# Patient Record
Sex: Female | Born: 1970 | Hispanic: No | Smoking: Never smoker
Health system: Southern US, Community
[De-identification: ages and names within clinical notes are randomized; demographics above are authoritative.]

## PROBLEM LIST (undated history)

## (undated) DIAGNOSIS — IMO0001 Reserved for inherently not codable concepts without codable children: Secondary | ICD-10-CM

## (undated) DIAGNOSIS — Z973 Presence of spectacles and contact lenses: Secondary | ICD-10-CM

## (undated) DIAGNOSIS — Z923 Personal history of irradiation: Secondary | ICD-10-CM

## (undated) DIAGNOSIS — D649 Anemia, unspecified: Secondary | ICD-10-CM

## (undated) DIAGNOSIS — C539 Malignant neoplasm of cervix uteri, unspecified: Secondary | ICD-10-CM

## (undated) HISTORY — PX: WISDOM TOOTH EXTRACTION: SHX21

## (undated) HISTORY — DX: Personal history of irradiation: Z92.3

---

## 2009-12-27 ENCOUNTER — Ambulatory Visit (HOSPITAL_COMMUNITY): Admission: RE | Admit: 2009-12-27 | Discharge: 2009-12-27 | Payer: Self-pay | Admitting: Obstetrics

## 2010-04-22 DIAGNOSIS — IMO0001 Reserved for inherently not codable concepts without codable children: Secondary | ICD-10-CM

## 2010-04-22 HISTORY — DX: Reserved for inherently not codable concepts without codable children: IMO0001

## 2011-10-14 ENCOUNTER — Inpatient Hospital Stay (HOSPITAL_COMMUNITY): Payer: Self-pay

## 2011-10-14 ENCOUNTER — Encounter (HOSPITAL_COMMUNITY): Payer: Self-pay | Admitting: Obstetrics and Gynecology

## 2011-10-14 ENCOUNTER — Inpatient Hospital Stay (HOSPITAL_COMMUNITY)
Admission: AD | Admit: 2011-10-14 | Discharge: 2011-10-14 | Disposition: A | Discharge status: Home / Self Care | Payer: Self-pay | Source: Ambulatory Visit | Attending: Obstetrics and Gynecology | Admitting: Obstetrics and Gynecology

## 2011-10-14 DIAGNOSIS — N921 Excessive and frequent menstruation with irregular cycle: Secondary | ICD-10-CM | POA: Diagnosis present

## 2011-10-14 DIAGNOSIS — D259 Leiomyoma of uterus, unspecified: Secondary | ICD-10-CM | POA: Diagnosis present

## 2011-10-14 DIAGNOSIS — D649 Anemia, unspecified: Secondary | ICD-10-CM | POA: Diagnosis present

## 2011-10-14 DIAGNOSIS — N946 Dysmenorrhea, unspecified: Secondary | ICD-10-CM | POA: Insufficient documentation

## 2011-10-14 DIAGNOSIS — N920 Excessive and frequent menstruation with regular cycle: Secondary | ICD-10-CM | POA: Insufficient documentation

## 2011-10-14 HISTORY — DX: Reserved for inherently not codable concepts without codable children: IMO0001

## 2011-10-14 LAB — CBC
MCH: 23.1 pg — ABNORMAL LOW (ref 26.0–34.0)
MCHC: 28.1 g/dL — ABNORMAL LOW (ref 30.0–36.0)
MCV: 82.1 fL (ref 78.0–100.0)
Platelets: 237 10*3/uL (ref 150–400)

## 2011-10-14 MED ORDER — MEDROXYPROGESTERONE ACETATE 150 MG/ML IM SUSP
150.0000 mg | INTRAMUSCULAR | Status: AC
  Administered 2011-10-14: 150 mg via INTRAMUSCULAR
  Filled 2011-10-14: qty 1

## 2011-10-14 MED ORDER — INTEGRA 62.5-62.5-40-3 MG PO CAPS: 1.0000 | ORAL_CAPSULE | Freq: Two times a day (BID) | ORAL | Status: DC

## 2011-10-14 MED ORDER — FERROUS SULFATE 325 (65 FE) MG PO TABS: 325.0000 mg | ORAL_TABLET | Freq: Three times a day (TID) | ORAL | Status: DC

## 2011-10-14 NOTE — Discharge Instructions (Signed)
Menorrhagia  Dysfunctional uterine bleeding is different from a normal menstrual period. When periods are heavy or there is more bleeding than is usual for you, it is called menorrhagia. It may be caused by hormonal imbalance, or physical, metabolic, or other problems. Examination is necessary in order that your caregiver may treat treatable causes. If this is a continuing problem, a D&C may be needed. That means that the cervix (the opening of the uterus or womb) is dilated (stretched larger) and the lining of the uterus is scraped out. The tissue scraped out is then examined under a microscope by a specialist (pathologist) to make sure there is nothing of concern that needs further or more extensive treatment.  HOME CARE INSTRUCTIONS    If medications were prescribed, take exactly as directed. Do not change or switch medications without consulting your caregiver.   Long term heavy bleeding may result in iron deficiency. Your caregiver may have prescribed iron pills. They help replace the iron your body lost from heavy bleeding. Take exactly as directed. Iron may cause constipation. If this becomes a problem, increase the bran, fruits, and roughage in your diet.   Do not take aspirin or medicines that contain aspirin one week before or during your menstrual period. Aspirin may make the bleeding worse.   If you need to change your sanitary pad or tampon more than once every 2 hours, stay in bed and rest as much as possible until the bleeding stops.   Eat well-balanced meals. Eat foods high in iron. Examples are leafy green vegetables, meat, liver, eggs, and whole grain breads and cereals. Do not try to lose weight until the abnormal bleeding has stopped and your blood iron level is back to normal.  SEEK MEDICAL CARE IF:    You need to change your sanitary pad or tampon more than once an hour.   You develop nausea (feeling sick to your stomach) and vomiting, dizziness, or diarrhea while you are taking your  medicine.   You have any problems that may be related to the medicine you are taking.  SEEK IMMEDIATE MEDICAL CARE IF:    You have a fever.   You develop chills.   You develop severe bleeding or start to pass blood clots.   You feel dizzy or faint.  MAKE SURE YOU:    Understand these instructions.   Will watch your condition.   Will get help right away if you are not doing well or get worse.  Document Released: 04/08/2005 Document Revised: 03/28/2011 Document Reviewed: 11/27/2007  ExitCare Patient Information 2012 [employer].  Iron Deficiency Anemia  There are many types of anemia. Iron deficiency anemia is the most common. Iron deficiency anemia is a decrease in the number of red blood cells caused by too little iron. Without enough iron, your body does not produce enough hemoglobin. Hemoglobin is a substance in red blood cells that carries oxygen to the body's tissues. Iron deficiency anemia may leave you tired and short of breath.  CAUSES    Lack of iron in the diet.   This may be seen in infants and children, because there is little iron in milk.   This may be seen in adults who do not eat enough iron-rich foods.   This may be seen in pregnant or breastfeeding women who do not take iron supplements. There is a much higher need for iron intake at these times.   Poor absorption of iron, as seen with intestinal disorders.     Intestinal bleeding.   Heavy periods.  SYMPTOMS   Mild anemia may not be noticeable. Symptoms may include:   Fatigue.   Headache.   Pale skin.   Weakness.   Shortness of breath.   Dizziness.   Cold hands and feet.   Fast or irregular heartbeat.  DIAGNOSIS   Diagnosis requires a thorough evaluation and physical exam by your caregiver.   Blood tests are generally used to confirm iron deficiency anemia.   Additional tests may be done to find the underlying cause of your anemia. These may include:   Testing for blood in the stool (fecal occult blood test).   A  procedure to see inside the colon and rectum (colonoscopy).   A procedure to see inside the esophagus and stomach (endoscopy).  TREATMENT    Correcting the cause of the iron deficiency is the first step.   Medicines, such as oral contraceptives, can make heavy menstrual flows lighter.   Antibiotics and other medicines can be used to treat peptic ulcers.   Surgery may be needed to remove a bleeding polyp, tumor, or fibroid.   Often, iron supplements (ferrous sulfate) are taken.   For the best iron absorption, take these supplements with an empty stomach.   You may need to take the supplements with food if you cannot tolerate them on an empty stomach. Vitamin C improves the absorption of iron. Your caregiver may recommend taking your iron tablets with a glass of orange juice or vitamin C supplement.   Milk and antacids should not be taken at the same time as iron supplements. They may interfere with the absorption of iron.   Iron supplements can cause constipation. A stool softener is often recommended.   Pregnant and breastfeeding women will need to take extra iron, because their normal diet usually will not provide the required amount.   Patients who cannot tolerate iron by mouth can take it through a vein (intravenously) or by an injection into the muscle.  HOME CARE INSTRUCTIONS    Ask your dietitian for help with diet questions.   Take iron and vitamins as directed by your caregiver.   Eat a diet rich in iron. Eat liver, lean beef, whole-grain bread, eggs, dried fruit, and dark green leafy vegetables.  SEEK IMMEDIATE MEDICAL CARE IF:    You have a fainting episode. Do not drive yourself. Call your local emergency services (911 in [place] if no other help is available.   You have chest pain, nausea, or vomiting.   You develop severe or increased shortness of breath with activities.   You develop weakness or increased thirst.   You have a rapid heartbeat.   You develop unexplained sweating or  become lightheaded when getting up from a chair or bed.  MAKE SURE YOU:    Understand these instructions.   Will watch your condition.   Will get help right away if you are not doing well or get worse.  Document Released: 04/05/2000 Document Revised: 03/28/2011 Document Reviewed: 08/15/2009  ExitCare Patient Information 2012 [employer].

## 2011-10-14 NOTE — MAU Note (Signed)
"  I've been bleeding since March.  I am having some cramping.  It is cramping in the whole abd.  I was seen in 2012 at the [facility] clinic and they gave me iron for 1 month."

## 2011-10-14 NOTE — MAU Provider Note (Signed)
History     CSN: [id number]  Arrival date and time: 10/14/11 1149   First Provider Initiated Contact with Patient 10/14/11 1352      Chief Complaint  Patient presents with  . Dysmenorrhea   HPI [id number] presents to MAU with report of menses x3 months with continued daily vaginal bleeding every day during that time.  She was evaluated for heavy menses in 2011 or 2012 and diagnosed with uterine fibroids at that time.  She refuses hospital admission for treatment and desires medication to reduce bleeding and iron for her anemia.  She is not sexually active and denies vaginal itching/burning, urinary symptoms, h/a, dizziness, n/v, or fever/chills.  She does report increasing weakness as her bleeding continues.  Language line for [employer] used for all communication.   Past Medical History  Diagnosis Date  . Fibroid (bleeding) (uterine) 2012    Past Surgical History  Procedure Date  . No past surgeries     History reviewed. No pertinent family history.  History  Substance Use Topics  . Smoking status: Never Smoker   . Smokeless tobacco: Not on file  . Alcohol Use: No    Allergies: No Known Allergies  Prescriptions prior to admission  Medication Sig Dispense Refill  . ferrous sulfate 325 (65 FE) MG tablet Take 325 mg by mouth daily with breakfast.        Review of Systems  Constitutional: Positive for malaise/fatigue. Negative for fever and chills.  Eyes: Negative for blurred vision.  Respiratory: Negative for cough and shortness of breath.   Cardiovascular: Negative for chest pain.  Gastrointestinal: Negative for heartburn, nausea, vomiting and abdominal pain.  Genitourinary: Negative for dysuria, urgency and frequency.  Musculoskeletal: Negative.   Neurological: Positive for weakness. Negative for dizziness and headaches.  Psychiatric/Behavioral: Negative for depression.   Physical Exam   Blood pressure 130/64, pulse 77, temperature 98.2 F (36.8 C),  temperature source Oral, resp. rate 18, height 5\' 3"  (1.6 m), weight 82.668 kg (182 lb 4 oz), last menstrual period 07/05/2011, SpO2 100.00%. Orthostatic VS WNL  Physical Exam  Nursing note and vitals reviewed. Constitutional: She is oriented to person, place, and time. She appears well-developed and well-nourished.  Neck: Normal range of motion.  Cardiovascular: Normal rate, regular rhythm and normal heart sounds.   Respiratory: Effort normal and breath sounds normal.  GI: Soft. Bowel sounds are normal.  Genitourinary:       Pelvic exam: Cervix pink, visually closed, without lesion, moderate amount dark red blood pooling in vagina, no clots noted, vaginal walls and external genitalia normal Bimanual exam: Cervix 0/long/high, firm, posterior, neg CMT, uterus nontender, slightly enlarged (~7week size) and irregular in shape, adnexa without tenderness, enlargement, or mass  Musculoskeletal: Normal range of motion.  Neurological: She is alert and oriented to person, place, and time.  Skin: Skin is warm and dry.  Psychiatric: She has a normal mood and affect. Her behavior is normal. Judgment and thought content normal.    MAU Course  Procedures CBC, pelvic/bimanual exam, pelvic u/s  Results for orders placed during the hospital encounter of 10/14/11 (from the past 24 hour(s))  CBC     Status: Abnormal   Collection Time   10/14/11 12:23 PM      Component Value Range   WBC 5.5  4.0 - 10.5 K/uL   RBC 2.34 (*) 3.87 - 5.11 MIL/uL   Hemoglobin 5.4 (*) 12.0 - 15.0 g/dL   HCT 47.8 (*) 29.5 - 62.1 %  MCV 82.1  78.0 - 100.0 fL   MCH 23.1 (*) 26.0 - 34.0 pg   MCHC 28.1 (*) 30.0 - 36.0 g/dL   RDW 13.0 (*) 86.5 - 78.4 %   Platelets 237  150 - 400 K/uL   US Transvaginal Non-ob  10/14/2011  *RADIOLOGY REPORT*  Clinical Data: Abnormal uterine bleeding.  Fibroids.  LMP 07/05/2011  TRANSABDOMINAL AND TRANSVAGINAL ULTRASOUND OF PELVIS  Technique:  Both transabdominal and transvaginal ultrasound  examinations of the pelvis were performed.  Transabdominal technique was performed for global imaging of the pelvis including uterus, ovaries, adnexal regions, and pelvic cul-de-sac.  It was necessary to proceed with endovaginal exam following the transabdominal exam to visualize the endometrium and ovaries.  Comparison:  12/27/2009  Findings: Uterus:  11.7 x 7.4 x 6.8 cm. An intramural fibroidis again seen in the posterior corpus which measures 3.5 cm in maximum diameter.  A second fibroid is seen in the anterior fundal region which measures approximately 3.6 cm, and has a partial submucosal component indenting the endometrial cavity.  Endometrium: Double layer endometrial thickness measures 12 mm.  Right ovary: 6.0 x 3.5 x 4.1 cm.  Several follicles, with dominant simple cyst measuring 3.8 cm in maximum diameter with benign features.  Left ovary: 4.4 x 2.0 x 2.7 cm.  Normal appearance.  2.2 cm simple cyst noted with benign features.  Other Findings:  No free fluid  IMPRESSION:  1.  No significant change in uterine fibroids, one of which in the fundus measuring 3.6 cm has a partial submucosal component involving the endometrial cavity.  Pelvic MRI with contrast may be helpful if surgical resection as planned. 2. 3.8 cm simple right ovarian cyst, with benign characteristics.  Original Report Authenticated By: Danae Orleans, M.D.   US Pelvis Complete  10/14/2011  *RADIOLOGY REPORT*  Clinical Data: Abnormal uterine bleeding.  Fibroids.  LMP 07/05/2011  TRANSABDOMINAL AND TRANSVAGINAL ULTRASOUND OF PELVIS  Technique:  Both transabdominal and transvaginal ultrasound examinations of the pelvis were performed.  Transabdominal technique was performed for global imaging of the pelvis including uterus, ovaries, adnexal regions, and pelvic cul-de-sac.  It was necessary to proceed with endovaginal exam following the transabdominal exam to visualize the endometrium and ovaries.  Comparison:  12/27/2009  Findings: Uterus:   11.7 x 7.4 x 6.8 cm. An intramural fibroidis again seen in the posterior corpus which measures 3.5 cm in maximum diameter.  A second fibroid is seen in the anterior fundal region which measures approximately 3.6 cm, and has a partial submucosal component indenting the endometrial cavity.  Endometrium: Double layer endometrial thickness measures 12 mm.  Right ovary: 6.0 x 3.5 x 4.1 cm.  Several follicles, with dominant simple cyst measuring 3.8 cm in maximum diameter with benign features.  Left ovary: 4.4 x 2.0 x 2.7 cm.  Normal appearance.  2.2 cm simple cyst noted with benign features.  Other Findings:  No free fluid  IMPRESSION:  1.  No significant change in uterine fibroids, one of which in the fundus measuring 3.6 cm has a partial submucosal component involving the endometrial cavity.  Pelvic MRI with contrast may be helpful if surgical resection as planned. 2. 3.8 cm simple right ovarian cyst, with benign characteristics.  Original Report Authenticated By: Danae Orleans, M.D.   Assessment and Plan  A: Menometrorrhagia Fibroid uterus Anemia, Hgb 5.4, pt stable and asymptomatic except mild weakness and fatigue   P: Pt refuses hospital admission and does not want blood products to treat  anemia Depo Provera dose x1 in MAU D/C home with bleeding precautions Discussed risks of severe anemia with pt and reasons to return to hospital Integra BID, discussed costs with pt without insurance and pt prefers r/t GI difficulties with PO iron Appointment for pt this week at Gyn clinic to f/u Return to MAU as needed  LEFTWICH-KIRBY, Ignazio Kincaid 10/14/2011, 3:00 PM

## 2011-10-14 NOTE — Progress Notes (Signed)
Bonna Gains, CNM recommending to pt the plan of wanting to admit pt for blood transfusion.  Pt states, "I cannot be admitted for blood.  I cannot afford to be admitted here.  I am not working and my husband just started working part-time.  I can treat myself at home with iron and I will follow-up with the clinic downstairs."

## 2011-10-14 NOTE — MAU Note (Signed)
Pt/spouse state pt has had menstrual cycle since March. May change pad q30 minutes at times. Feeling weak x53month.

## 2011-10-14 NOTE — MAU Provider Note (Signed)
Agree with above note.  Destynee Stringfellow 10/14/2011 5:16 PM

## 2011-10-18 ENCOUNTER — Ambulatory Visit: Payer: Self-pay | Admitting: Obstetrics & Gynecology

## 2011-11-26 ENCOUNTER — Telehealth: Payer: Self-pay | Admitting: Medical

## 2011-11-26 NOTE — Telephone Encounter (Signed)
Caitlin Flowers called on behalf of the pt who was seen in MAU for evaluation of heavy bleeding. She had an appt for f/u in the clinic which she was not aware of the subsequently no showed. [name] would like to discuss the "injection" that the patient states was given in MAU as well as the appropriate next steps for follow-up with our clinic. After review of the MAU note the patient saw Dr. Cherrie Gauze and was given Depo Provera 150 mg at the time of the MAU visit. LM for [name] to return call to clinic for more information regarding follow-up and the injection for this patient.

## 2011-11-27 ENCOUNTER — Ambulatory Visit (INDEPENDENT_AMBULATORY_CARE_PROVIDER_SITE_OTHER): Payer: Self-pay | Admitting: Obstetrics & Gynecology

## 2011-11-27 ENCOUNTER — Encounter: Payer: Self-pay | Admitting: Obstetrics & Gynecology

## 2011-11-27 VITALS — BP 124/69 | HR 80 | Temp 98.7°F | Ht 65.0 in | Wt 175.8 lb

## 2011-11-27 DIAGNOSIS — D649 Anemia, unspecified: Secondary | ICD-10-CM

## 2011-11-27 LAB — CBC
MCHC: 28.2 g/dL — ABNORMAL LOW (ref 30.0–36.0)
MCV: 72 fL — ABNORMAL LOW (ref 78.0–100.0)
Platelets: 308 10*3/uL (ref 150–400)
RDW: 21.3 % — ABNORMAL HIGH (ref 11.5–15.5)
WBC: 6.3 10*3/uL (ref 4.0–10.5)

## 2011-11-27 MED ORDER — MEDROXYPROGESTERONE ACETATE 5 MG PO TABS: 5.0000 mg | ORAL_TABLET | Freq: Every day | ORAL | Status: DC

## 2011-11-27 NOTE — Progress Notes (Signed)
Patient ID: Caitlin Flowers, female   DOB: 1970-05-02, 41 y.o.   MRN: [id number]  Chief Complaint  Patient presents with  . Fibroids    causing heavy bleeding, currently not bleeding spotted earlier today.  Pt has decided not to use Depo Provera.     HPI Caitlin Flowers is a 41 y.o. female.  Patient is a refugee from [place]. She has been in this country for since 2011. She has been diagnosed with fibroid uterus and menorrhagia and anemia and an MAU visit on June 24 showed severe anemia and ultrasound confirmed presence of fibroids. One fibroid had a submucosal component. She had previously been seen by another gynecologist in [place]. HPI  Past Medical History  Diagnosis Date  . Fibroid (bleeding) (uterine) 2012    Past Surgical History  Procedure Date  . No past surgeries     No family history on file.  Social History History  Substance Use Topics  . Smoking status: Never Smoker   . Smokeless tobacco: Never Used  . Alcohol Use: No    No Known Allergies  Current Outpatient Prescriptions  Medication Sig Dispense Refill  . medroxyPROGESTERone (DEPO-PROVERA) 150 MG/ML injection Inject 150 mg into the muscle every 3 (three) months.      . Fe Fum-FePoly-Vit C-Vit B3 (INTEGRA) 62.5-62.5-40-3 MG CAPS Take 1 tablet by mouth 2 (two) times daily.  30 capsule  2  . medroxyPROGESTERone (PROVERA) 5 MG tablet Take 1 tablet (5 mg total) by mouth daily.  30 tablet  3    Review of Systems Review of Systems  Constitutional: Positive for fatigue. Negative for fever.  Respiratory: Negative for chest tightness and shortness of breath.   Genitourinary: Positive for vaginal bleeding and menstrual problem.  Neurological: Positive for dizziness, weakness, light-headedness and headaches.    Blood pressure 124/69, pulse 80, temperature 98.7 F (37.1 C), temperature source Oral, height 5\' 5"  (1.651 m), weight 175 lb 12.8 oz (79.742 kg), last menstrual period 10/26/2011.  Physical Exam Physical  Exam  Constitutional: She appears well-developed and well-nourished.       Mildly pale  Pulmonary/Chest: Effort normal.  Abdominal: Soft. She exhibits no mass.  Genitourinary: Vagina normal. No vaginal discharge found.       Minimal blood. Cervix is normal. Uterus about 6-8 weeks size.  Neurological: She is alert.  Skin: Skin is warm and dry. There is pallor.  Psychiatric: She has a normal mood and affect. Her behavior is normal.    Data Reviewed *RADIOLOGY REPORT*  Clinical Data: Abnormal uterine bleeding. Fibroids. LMP  07/05/2011  TRANSABDOMINAL AND TRANSVAGINAL ULTRASOUND OF PELVIS  Technique: Both transabdominal and transvaginal ultrasound  examinations of the pelvis were performed. Transabdominal  technique was performed for global imaging of the pelvis including  uterus, ovaries, adnexal regions, and pelvic cul-de-sac.  It was necessary to proceed with endovaginal exam following the  transabdominal exam to visualize the endometrium and ovaries.  Comparison: 12/27/2009  Findings:  Uterus: 11.7 x 7.4 x 6.8 cm. An intramural fibroidis again seen in  the posterior corpus which measures 3.5 cm in maximum diameter. A  second fibroid is seen in the anterior fundal region which measures  approximately 3.6 cm, and has a partial submucosal component  indenting the endometrial cavity.  Endometrium: Double layer endometrial thickness measures 12 mm.  Right ovary: 6.0 x 3.5 x 4.1 cm. Several follicles, with dominant  simple cyst measuring 3.8 cm in maximum diameter with benign  features.  Left ovary: 4.4 x 2.0  x 2.7 cm. Normal appearance. 2.2 cm simple  cyst noted with benign features.  Other Findings: No free fluid  IMPRESSION:  1. No significant change in uterine fibroids, one of which in the  fundus measuring 3.6 cm has a partial submucosal component  involving the endometrial cavity. Pelvic MRI with contrast may be  helpful if surgical resection as planned.  2. 3.8 cm simple  right ovarian cyst, with benign characteristics.  Original Report Authenticated By: Danae Orleans, M.D.      CBC    Component Value Date/Time   WBC 5.5 10/14/2011 1223   RBC 2.34* 10/14/2011 1223   HGB 5.4* 10/14/2011 1223   HCT 19.2* 10/14/2011 1223   PLT 237 10/14/2011 1223   MCV 82.1 10/14/2011 1223   MCH 23.1* 10/14/2011 1223   MCHC 28.1* 10/14/2011 1223   RDW 20.5* 10/14/2011 1223     Assessment    Severe anemia and dysfunctional uterine bleeding with uterine fibroids, submucosal component.    Plan    Patient received Depo-Provera on 10/14/2011. She has had some breakthrough bleeding. I will add Provera 5 mg tablets by mouth daily. CBC is ordered today. She is a candidate for hysteroscopy and possible myomectomy. She may need more time with hormone therapy and iron to recover from her severe anemia prior to scheduling surgery.   counseled with help of [facility]  Interpreter [phone]    ARNOLD,JAMES 11/27/2011, 5:07 PM

## 2011-11-27 NOTE — Patient Instructions (Signed)
Fibroids You have been diagnosed as having a fibroid. Fibroids are smooth muscle lumps (tumors) which can occur any place in a woman's body. They are usually in the womb (uterus). The most common problem (symptom) of fibroids is bleeding. Over time this may cause low red blood cells (anemia). Other symptoms include feelings of pressure and pain in the pelvis. The diagnosis (learning what is wrong) of fibroids is made by physical exam. Sometimes tests such as an ultrasound are used. This is helpful when fibroids are felt around the ovaries and to look for tumors. TREATMENT   Most fibroids do not need surgical or medical treatment. Sometimes a tissue sample (biopsy) of the lining of the uterus is done to rule out cancer. If there is no cancer and only a small amount of bleeding, the problem can be watched.   Hormonal treatment can improve the problem.   When surgery is needed, it can consist of removing the fibroid. Vaginal birth may not be possible after the removal of fibroids. This depends on where they are and the extent of surgery. When pregnancy occurs with fibroids it is usually normal.   Your caregiver can help decide which treatments are best for you.  HOME CARE INSTRUCTIONS   Do not use aspirin as this may increase bleeding problems.   If your periods (menses) are heavy, record the number of pads or tampons used per month. Bring this information to your caregiver. This can help them determine the best treatment for you.  SEEK IMMEDIATE MEDICAL CARE IF:  You have pelvic pain or cramps not controlled with medications, or experience a sudden increase in pain.   You have an increase of pelvic bleeding between and during menses.   You feel lightheaded or have fainting spells.   You develop worsening belly (abdominal) pain.  Document Released: 04/05/2000 Document Revised: 03/28/2011 Document Reviewed: 11/26/2007 ExitCare Patient Information 2012 [employer].    

## 2011-11-27 NOTE — Progress Notes (Signed)
[facility] Interpreters # [phone]

## 2011-11-28 ENCOUNTER — Telehealth: Payer: Self-pay | Admitting: *Deleted

## 2011-11-28 ENCOUNTER — Telehealth: Payer: Self-pay | Admitting: General Practice

## 2011-11-28 NOTE — Telephone Encounter (Signed)
Message left on nurse voicemail Wednesday 8/7 @ 2132 from [facility] regarding critical results. Spoke with Dr Debroah Loop and notified him of patient's lab result and he stated he would evaluate the results himself.

## 2011-11-28 NOTE — Telephone Encounter (Signed)
Message copied by Mannie Stabile on Thu Nov 28, 2011  2:12 PM ------      Message from: Adam Phenix      Created: Thu Nov 28, 2011 10:01 AM       Continue with provera and iron and return as scheduled. Notify patient that iron is still low            ARNOLD,JAMES

## 2011-11-28 NOTE — Telephone Encounter (Signed)
Called patient using [employer] Amharic interpreter [phone] got message that phone not accepting calls at this time. Will retry patient again later.

## 2011-12-02 NOTE — Telephone Encounter (Signed)
Called pt using [employer] interpreter # [phone]. LM for pt stating that iron is still low, continue on current medications and follow-up as previously scheduled.

## 2012-01-02 ENCOUNTER — Ambulatory Visit (INDEPENDENT_AMBULATORY_CARE_PROVIDER_SITE_OTHER): Payer: Self-pay | Admitting: Obstetrics & Gynecology

## 2012-01-02 ENCOUNTER — Encounter: Payer: Self-pay | Admitting: Obstetrics & Gynecology

## 2012-01-02 VITALS — BP 142/82 | HR 65 | Temp 97.6°F | Ht 63.0 in | Wt 178.9 lb

## 2012-01-02 DIAGNOSIS — D649 Anemia, unspecified: Secondary | ICD-10-CM

## 2012-01-02 DIAGNOSIS — D259 Leiomyoma of uterus, unspecified: Secondary | ICD-10-CM

## 2012-01-02 DIAGNOSIS — N921 Excessive and frequent menstruation with irregular cycle: Secondary | ICD-10-CM

## 2012-01-02 DIAGNOSIS — N920 Excessive and frequent menstruation with regular cycle: Secondary | ICD-10-CM

## 2012-01-02 LAB — CBC
Hemoglobin: 11.3 g/dL — ABNORMAL LOW (ref 12.0–15.0)
MCH: 26.5 pg (ref 26.0–34.0)
MCHC: 31 g/dL (ref 30.0–36.0)
Platelets: 231 10*3/uL (ref 150–400)
RBC: 4.26 MIL/uL (ref 3.87–5.11)

## 2012-01-02 NOTE — Progress Notes (Signed)
Patient ID: Caitlin Flowers, female   DOB: 01/08/1971, 41 y.o.   MRN: [id number] Patient's last menstrual period was 11/27/2011. Patient returns today stating that since she has started Provera her bleeding has stopped. She does not feel dizzy. I would like to check a CBC today and if her blood counts are improved enough we can schedule hysteroscopy D&C and myomectomy. Her counseling was done with an interpreter via the phone. Her questions were answered.  ARNOLD,JAMES 01/02/2012 2:09 PM

## 2012-01-02 NOTE — Patient Instructions (Signed)
Fibroids You have been diagnosed as having a fibroid. Fibroids are smooth muscle lumps (tumors) which can occur any place in a woman's body. They are usually in the womb (uterus). The most common problem (symptom) of fibroids is bleeding. Over time this may cause low red blood cells (anemia). Other symptoms include feelings of pressure and pain in the pelvis. The diagnosis (learning what is wrong) of fibroids is made by physical exam. Sometimes tests such as an ultrasound are used. This is helpful when fibroids are felt around the ovaries and to look for tumors. TREATMENT   Most fibroids do not need surgical or medical treatment. Sometimes a tissue sample (biopsy) of the lining of the uterus is done to rule out cancer. If there is no cancer and only a small amount of bleeding, the problem can be watched.   Hormonal treatment can improve the problem.   When surgery is needed, it can consist of removing the fibroid. Vaginal birth may not be possible after the removal of fibroids. This depends on where they are and the extent of surgery. When pregnancy occurs with fibroids it is usually normal.   Your caregiver can help decide which treatments are best for you.  HOME CARE INSTRUCTIONS   Do not use aspirin as this may increase bleeding problems.   If your periods (menses) are heavy, record the number of pads or tampons used per month. Bring this information to your caregiver. This can help them determine the best treatment for you.  SEEK IMMEDIATE MEDICAL CARE IF:  You have pelvic pain or cramps not controlled with medications, or experience a sudden increase in pain.   You have an increase of pelvic bleeding between and during menses.   You feel lightheaded or have fainting spells.   You develop worsening belly (abdominal) pain.  Document Released: 04/05/2000 Document Revised: 03/28/2011 Document Reviewed: 11/26/2007 ExitCare Patient Information 2012 [employer].    

## 2012-01-02 NOTE — Progress Notes (Signed)
Interpreter # [phone]

## 2012-01-16 ENCOUNTER — Encounter: Payer: Self-pay | Admitting: *Deleted

## 2012-01-23 ENCOUNTER — Encounter (HOSPITAL_COMMUNITY): Payer: Self-pay | Admitting: Pharmacist

## 2012-01-30 ENCOUNTER — Ambulatory Visit (INDEPENDENT_AMBULATORY_CARE_PROVIDER_SITE_OTHER): Payer: Self-pay | Admitting: Obstetrics & Gynecology

## 2012-01-30 ENCOUNTER — Encounter: Payer: Self-pay | Admitting: Obstetrics & Gynecology

## 2012-01-30 VITALS — BP 142/80 | HR 79 | Temp 98.3°F | Ht 63.0 in | Wt 185.1 lb

## 2012-01-30 DIAGNOSIS — D259 Leiomyoma of uterus, unspecified: Secondary | ICD-10-CM

## 2012-01-30 DIAGNOSIS — Z23 Encounter for immunization: Secondary | ICD-10-CM

## 2012-01-30 DIAGNOSIS — D219 Benign neoplasm of connective and other soft tissue, unspecified: Secondary | ICD-10-CM

## 2012-01-30 MED ORDER — INFLUENZA VIRUS VACC SPLIT PF IM SUSP
0.5000 mL | INTRAMUSCULAR | Status: AC
  Administered 2012-01-30: 0.5 mL via INTRAMUSCULAR

## 2012-01-30 MED ORDER — MEDROXYPROGESTERONE ACETATE 5 MG PO TABS: 5.0000 mg | ORAL_TABLET | Freq: Every day | ORAL | Status: DC

## 2012-01-30 NOTE — Patient Instructions (Signed)
Myomectomy Myoma is a non-cancerous tumor made up of fibrous tissue. It is also called leiomyoma, but more often called a fibroid tumor. Myomectomy is the removal of a fibroid tumor without removing another organ, like the uterus or ovary, with it. Fibroids range from the size of a pea to a grapefruit. They are rarely cancerous. Myomas only need treatment when they are growing or when they cause symptoms, such aspain, pressure, bleeding, and pain with intercourse. LET YOUR CAREGIVER KNOW ABOUT:  Any allergies, especially to medicines.  If you develop a cold or an infection before your surgery.  Medicines taken, including vitamins, herbs, eyedrops, over-ther-counter medicines, and creams.  Use of steroids (by mouth or creams).  Previous problems with numbing medicines.  History of blood clots or other bleeding problems.  Other health problems, such as diabetes, kidney, heart, or lung problems.  Previous surgery.  Possibility of pregnancy, if this applies. RISKS AND COMPLICATIONS   Excessive bleeding.  Infection.  Injury to other organs.  Blood clots in the legs, chest, and brain.  Scar tissue (adhesions) on other organs and in the pelvis.  Death during or after the surgery. BEFORE THE PROCEDURE  Follow your caregiver's advice regarding your surgery and preparing for surgery.  Avoid taking aspirin or blood thinners as directed by your caregiver.  DO NOT eat or drink anything after midnight on the night before surgery, or as directed by your caregiver.  DO NOT smoke (if you smoke) for 2 weeks before the surgery.  DO NOT drink alcohol the day before the surgery.  If you are admitted the day of the surgery,arrive1 hour before your surgery is scheduled.  Arrange to have someone take you home from the hospital.  Arrange to have someone care for you when you go home. PROCEDURE There are several ways to perform a myomectomy:  Hysteroscopy myomectomy. A lighted tube is  inserted inside the uterus. The tube will remove the fibroid. This is used when the fibroid is inside the cavity of the uterus.  Laparoscopic myomectomy. A long, lighted tube is inserted through 2 or 3 small incisions to see the organs in the pelvis. The fibroid is removed.  Myomectomy through a sugical cut (incicion) in the abdomen. The fibroid is removed through an incision made in the stomach. This way is performed when thethe fibroid cannot be removed with a hysteroscope or laprascope. AFTER THE PROCEDURE  If you had laparoscopic or hysteroscopic myomectomy, you may go home the same day or stay overnight.  If you had abdominal myomectomy, you may stay in the hospital a few days.  Your intravenous (IV)access tube and catheter will be removed in 1 or 2 days.  If you stay in the hospital, your caregiver will order pain medicine and a sleeping pill, if needed.  You may be placed on an antibiotic medicine, if needed.  You may be given written instructions and medicines before you are sent home. Document Released: 02/03/2007 Document Revised: 07/01/2011 Document Reviewed: 02/15/2009 ExitCare Patient Information 2013 [employer].  

## 2012-01-30 NOTE — Progress Notes (Signed)
Patient states medicine helped bleeding, but ran out and now is bleeding continuously again.  [id number] Patient's last menstrual period was 01/04/2012. Scheduled for hysteroscopy and myomectomy 02/11/12. Out of Provera and bleeding restarted. Procedure and risks explained via interpreter and questions answered. Provera 5 mg po daily renewed.  Pre op orders given.  ARNOLD,JAMES 1:37 PM 01/30/2012

## 2012-01-30 NOTE — Addendum Note (Signed)
Addended by: Sherre Lain A on: 01/30/2012 01:44 PM   Modules accepted: Orders

## 2012-02-04 ENCOUNTER — Inpatient Hospital Stay (HOSPITAL_COMMUNITY): Admission: RE | Admit: 2012-02-04 | Payer: Self-pay | Source: Ambulatory Visit

## 2012-02-11 ENCOUNTER — Encounter (HOSPITAL_COMMUNITY)
Admission: AD | Disposition: A | Discharge status: Home / Self Care | Payer: Self-pay | Source: Ambulatory Visit | Attending: Obstetrics & Gynecology

## 2012-02-11 ENCOUNTER — Encounter (HOSPITAL_COMMUNITY): Payer: Self-pay | Admitting: *Deleted

## 2012-02-11 ENCOUNTER — Ambulatory Visit (HOSPITAL_COMMUNITY)
Admission: AD | Admit: 2012-02-11 | Discharge: 2012-02-11 | Disposition: A | Discharge status: Home / Self Care | Payer: Medicaid Other | Source: Ambulatory Visit | Attending: Obstetrics & Gynecology | Admitting: Obstetrics & Gynecology

## 2012-02-11 ENCOUNTER — Encounter (HOSPITAL_COMMUNITY): Payer: Self-pay | Admitting: Anesthesiology

## 2012-02-11 ENCOUNTER — Ambulatory Visit (HOSPITAL_COMMUNITY): Payer: Medicaid Other | Admitting: Anesthesiology

## 2012-02-11 DIAGNOSIS — D250 Submucous leiomyoma of uterus: Secondary | ICD-10-CM | POA: Insufficient documentation

## 2012-02-11 DIAGNOSIS — N920 Excessive and frequent menstruation with regular cycle: Secondary | ICD-10-CM | POA: Insufficient documentation

## 2012-02-11 DIAGNOSIS — D219 Benign neoplasm of connective and other soft tissue, unspecified: Secondary | ICD-10-CM

## 2012-02-11 HISTORY — DX: Anemia, unspecified: D64.9

## 2012-02-11 HISTORY — PX: DILATION AND CURETTAGE OF UTERUS: SHX78

## 2012-02-11 LAB — CBC
HCT: 39.2 % (ref 36.0–46.0)
Hemoglobin: 12.7 g/dL (ref 12.0–15.0)
MCH: 29.6 pg (ref 26.0–34.0)
MCV: 91.4 fL (ref 78.0–100.0)
RBC: 4.29 MIL/uL (ref 3.87–5.11)
WBC: 7.8 10*3/uL (ref 4.0–10.5)

## 2012-02-11 SURGERY — DILATATION & CURETTAGE/HYSTEROSCOPY WITH VERSAPOINT RESECTION
Anesthesia: General | Site: Uterus | Wound class: Clean Contaminated

## 2012-02-11 MED ORDER — MEDROXYPROGESTERONE ACETATE 5 MG PO TABS: 5.0000 mg | ORAL_TABLET | Freq: Every day | ORAL | Status: DC

## 2012-02-11 MED ORDER — MIDAZOLAM HCL 2 MG/2ML IJ SOLN
INTRAMUSCULAR | Status: AC
  Filled 2012-02-11: qty 2

## 2012-02-11 MED ORDER — BUPIVACAINE HCL (PF) 0.5 % IJ SOLN
INTRAMUSCULAR | Status: AC
  Filled 2012-02-11: qty 30

## 2012-02-11 MED ORDER — HYDROCODONE-ACETAMINOPHEN 5-500 MG PO TABS: 1.0000 | ORAL_TABLET | Freq: Four times a day (QID) | ORAL | Status: DC | PRN

## 2012-02-11 MED ORDER — ONDANSETRON HCL 4 MG/2ML IJ SOLN
INTRAMUSCULAR | Status: DC | PRN
  Administered 2012-02-11: 4 mg via INTRAVENOUS

## 2012-02-11 MED ORDER — LIDOCAINE HCL (CARDIAC) 20 MG/ML IV SOLN
INTRAVENOUS | Status: AC
  Filled 2012-02-11: qty 5

## 2012-02-11 MED ORDER — ONDANSETRON HCL 4 MG/2ML IJ SOLN
INTRAMUSCULAR | Status: AC
  Filled 2012-02-11: qty 2

## 2012-02-11 MED ORDER — MIDAZOLAM HCL 5 MG/5ML IJ SOLN
INTRAMUSCULAR | Status: DC | PRN
  Administered 2012-02-11 (×2): 1 mg via INTRAVENOUS

## 2012-02-11 MED ORDER — FENTANYL CITRATE 0.05 MG/ML IJ SOLN: 25.0000 ug | INTRAMUSCULAR | Status: DC | PRN

## 2012-02-11 MED ORDER — PROPOFOL 10 MG/ML IV EMUL
INTRAVENOUS | Status: AC
  Filled 2012-02-11: qty 20

## 2012-02-11 MED ORDER — SODIUM CHLORIDE 0.9 % IR SOLN
Status: DC | PRN
  Administered 2012-02-11 (×4): 3000 mL

## 2012-02-11 MED ORDER — FENTANYL CITRATE 0.05 MG/ML IJ SOLN
INTRAMUSCULAR | Status: AC
  Filled 2012-02-11: qty 2

## 2012-02-11 MED ORDER — BUPIVACAINE-EPINEPHRINE 0.5% -1:200000 IJ SOLN
INTRAMUSCULAR | Status: DC | PRN
  Administered 2012-02-11: 8 mL

## 2012-02-11 MED ORDER — BUPIVACAINE-EPINEPHRINE (PF) 0.5% -1:200000 IJ SOLN
INTRAMUSCULAR | Status: AC
  Filled 2012-02-11: qty 10

## 2012-02-11 MED ORDER — LACTATED RINGERS IV SOLN
INTRAVENOUS | Status: DC
  Administered 2012-02-11: 125 mL/h via INTRAVENOUS

## 2012-02-11 MED ORDER — PROPOFOL 10 MG/ML IV EMUL
INTRAVENOUS | Status: DC | PRN
  Administered 2012-02-11: 140 mg via INTRAVENOUS

## 2012-02-11 MED ORDER — KETOROLAC TROMETHAMINE 30 MG/ML IJ SOLN
INTRAMUSCULAR | Status: AC
  Filled 2012-02-11: qty 1

## 2012-02-11 MED ORDER — FENTANYL CITRATE 0.05 MG/ML IJ SOLN
INTRAMUSCULAR | Status: DC | PRN
  Administered 2012-02-11 (×2): 50 ug via INTRAVENOUS

## 2012-02-11 SURGICAL SUPPLY — 20 items
CANISTER SUCTION 2500CC (MISCELLANEOUS) ×2 IMPLANT
CATH ROBINSON RED A/P 16FR (CATHETERS) ×2 IMPLANT
CLOTH BEACON ORANGE TIMEOUT ST (SAFETY) ×2 IMPLANT
CONTAINER PREFILL 10% NBF 60ML (FORM) ×4 IMPLANT
CORD ACTIVE DISPOSABLE (ELECTRODE)
CORD ELECTRO ACTIVE DISP (ELECTRODE) IMPLANT
DRESSING TELFA 8X3 (GAUZE/BANDAGES/DRESSINGS) ×2 IMPLANT
ELECT LOOP GYNE PRO 24FR (CUTTING LOOP)
ELECT REM PT RETURN 9FT ADLT (ELECTROSURGICAL) ×2
ELECTRODE LOOP GYNE PRO 24FR (CUTTING LOOP) IMPLANT
ELECTRODE REM PT RTRN 9FT ADLT (ELECTROSURGICAL) ×1 IMPLANT
ELECTRODE RT ANGLE VERSAPOINT (CUTTING LOOP) ×2 IMPLANT
GLOVE BIO SURGEON STRL SZ 6.5 (GLOVE) ×2 IMPLANT
GLOVE BIOGEL PI IND STRL 7.0 (GLOVE) ×2 IMPLANT
GLOVE BIOGEL PI INDICATOR 7.0 (GLOVE) ×2
GOWN STRL REIN XL XLG (GOWN DISPOSABLE) ×6 IMPLANT
PACK HYSTEROSCOPY LF (CUSTOM PROCEDURE TRAY) ×2 IMPLANT
PAD OB MATERNITY 4.3X12.25 (PERSONAL CARE ITEMS) ×2 IMPLANT
TOWEL OR 17X24 6PK STRL BLUE (TOWEL DISPOSABLE) ×4 IMPLANT
WATER STERILE IRR 1000ML POUR (IV SOLUTION) ×2 IMPLANT

## 2012-02-11 NOTE — Anesthesia Postprocedure Evaluation (Signed)
  Anesthesia Post-op Note  Patient: Caitlin Flowers  Procedure(s) Performed: Procedure(s) (LRB) with comments: DILATATION & CURETTAGE/HYSTEROSCOPY WITH VERSAPOINT RESECTION (N/A) - myomyectomy  Patient is awake and responsive. Pain and nausea are reasonably well controlled. Vital signs are stable and clinically acceptable. Oxygen saturation is clinically acceptable. There are no apparent anesthetic complications at this time. Patient is ready for discharge.

## 2012-02-11 NOTE — Transfer of Care (Signed)
Immediate Anesthesia Transfer of Care Note  Patient: Caitlin Flowers  Procedure(s) Performed: Procedure(s) (LRB) with comments: DILATATION & CURETTAGE/HYSTEROSCOPY WITH VERSAPOINT RESECTION (N/A) - myomyectomy  Patient Location: PACU  Anesthesia Type: General  Level of Consciousness: awake, alert  and oriented  Airway & Oxygen Therapy: Patient Spontanous Breathing and Patient connected to nasal cannula oxygen  Post-op Assessment: Report given to PACU RN and Post -op Vital signs reviewed and stable  Post vital signs: Reviewed and stable  Complications:

## 2012-02-11 NOTE — Op Note (Signed)
Procedure: Hysteroscopy, resection of submucous fibroid, and curettage preoperative diagnoses: Menometrorrhagia, history of anemia, submucous fibroid Postoperative diagnosis: Same Surgeon: Dr. Scheryl Darter Anesthesia: Gen. Specimen: Fragments of submucous fibroid and endometrial curettings Estimated blood loss: 50 mL Fluid deficit 760 mL Complications: None Drains: None Counts: Correct  Patient gave written consent for hysteroscopy resection of submucous fibroid and curettage. Patient identification was confirmed and she was brought to the operating room and general anesthesia was induced. She was placed in dorsal lithotomy position. Exam revealed a 6-8 week size uterus with no adnexal masses. Bladder was drained with a red rubber catheter and the perineum and vagina were sterilely prepped and draped. Was inserted. Percent Marcaine 1 200,000 epinephrine was infiltrated for intracervical block. Cervix was grasped with tenaculum. Cervix was dilated sufficiently to pass the resectoscope with the VersaPoint device. Videocamera was in use. Saline was used. Panoramic view of the endometrial cavity was seen and both tubal ostia were seen. An approximately 3 cm fibroid was seen the anterior fundus. The cutting loop was used to resect the fibroid. Near complete resection of the fibroid was accomplished. The fragments were obtained the specimen. Some uterine curettings were also obtained. All instruments were removed. She tolerated the procedure without complications. She was brought in stable condition to PACU.   Alessandro Griep 02/11/2012 11:33 AM

## 2012-02-11 NOTE — H&P (Signed)
Patient ID: Caitlin Flowers, female DOB: 03-13-1971, 41 y.o. MRN: [id number]  Chief Complaint   Patient presents with   .  Fibroids     causing heavy bleeding, currently not bleeding spotted earlier today. Pt has decided not to use Depo Provera.   HPI  Caitlin Flowers is a 41 y.o. female. Patient is a refugee from [place]. She has been in this country for since [id number]  Patient's last menstrual period was 01/04/2012.  She has been diagnosed with fibroid uterus and menorrhagia and anemia and an MAU visit on June 24 showed severe anemia and ultrasound confirmed presence of fibroids. One fibroid had a submucosal component. She had previously been seen by another gynecologist in [place].  HPI  Past Medical History   Diagnosis  Date   .  Fibroid (bleeding) (uterine)  2012    Past Surgical History   Procedure  Date   .  No past surgeries    No family history on file.  Social History  History   Substance Use Topics   .  Smoking status:  Never Smoker   .  Smokeless tobacco:  Never Used   .  Alcohol Use:  No   No Known Allergies  Current Outpatient Prescriptions   Medication  Sig  Dispense  Refill   .  medroxyPROGESTERone (DEPO-PROVERA) 150 MG/ML injection  Inject 150 mg into the muscle every 3 (three) months.     .  Fe Fum-FePoly-Vit C-Vit B3 (INTEGRA) 62.5-62.5-40-3 MG CAPS  Take 1 tablet by mouth 2 (two) times daily.  30 capsule  2   .  medroxyPROGESTERone (PROVERA) 5 MG tablet  Take 1 tablet (5 mg total) by mouth daily.  30 tablet  3   Review of Systems  Review of Systems  Constitutional: Positive for fatigue. Negative for fever.  Respiratory: Negative for chest tightness and shortness of breath.  Genitourinary: Positive for vaginal bleeding and menstrual problem.  Neurological: Positive for dizziness, weakness, light-headedness and headaches.  Filed Vitals:   02/04/12 1055 02/11/12 0848  BP:  132/82  Pulse:  70  Temp:  98.2 F (36.8 C)  TempSrc:  Oral  Resp:  18  Height: 5'  3" (1.6 m)   Weight: 185 lb (83.915 kg)   SpO2:  100%    Physical Exam  Physical Exam  Constitutional: She appears well-developed and well-nourished.  Mildly pale  Pulmonary/Chest: Effort normal.  Abdominal: Soft. She exhibits no mass.  Genitourinary: Vagina normal. No vaginal discharge found.  Minimal blood. Cervix is normal. Uterus about 6-8 weeks size.  Neurological: She is alert.  Skin: Skin is warm and dry.  Psychiatric: She has a normal mood and affect. Her behavior is normal.  Data Reviewed  *RADIOLOGY REPORT*  Clinical Data: Abnormal uterine bleeding. Fibroids. LMP  07/05/2011  TRANSABDOMINAL AND TRANSVAGINAL ULTRASOUND OF PELVIS  Technique: Both transabdominal and transvaginal ultrasound  examinations of the pelvis were performed. Transabdominal  technique was performed for global imaging of the pelvis including  uterus, ovaries, adnexal regions, and pelvic cul-de-sac.  It was necessary to proceed with endovaginal exam following the  transabdominal exam to visualize the endometrium and ovaries.  Comparison: 12/27/2009  Findings:  Uterus: 11.7 x 7.4 x 6.8 cm. An intramural fibroidis again seen in  the posterior corpus which measures 3.5 cm in maximum diameter. A  second fibroid is seen in the anterior fundal region which measures  approximately 3.6 cm, and has a partial submucosal component  indenting the endometrial cavity.  Endometrium: Double layer endometrial thickness measures 12 mm.  Right ovary: 6.0 x 3.5 x 4.1 cm. Several follicles, with dominant  simple cyst measuring 3.8 cm in maximum diameter with benign  features.  Left ovary: 4.4 x 2.0 x 2.7 cm. Normal appearance. 2.2 cm simple  cyst noted with benign features.  Other Findings: No free fluid  IMPRESSION:  1. No significant change in uterine fibroids, one of which in the  fundus measuring 3.6 cm has a partial submucosal component  involving the endometrial cavity. Pelvic MRI with contrast may be    helpful if surgical resection as planned.  2. 3.8 cm simple right ovarian cyst, with benign characteristics.  Original Report Authenticated By: Danae Orleans, M.D.      CBC    Component  Value  Date/Time    WBC  5.5  10/14/2011 1223    RBC  2.34*  10/14/2011 1223    HGB  5.4*  10/14/2011 1223    HCT  19.2*  10/14/2011 1223    PLT  237  10/14/2011 1223    MCV  82.1  10/14/2011 1223    MCH  23.1*  10/14/2011 1223    MCHC  28.1*  10/14/2011 1223    RDW  20.5*  10/14/2011 1223    CBC    Component Value Date/Time   WBC 7.8 02/11/2012 0827   RBC 4.29 02/11/2012 0827   HGB 12.7 02/11/2012 0827   HCT 39.2 02/11/2012 0827   PLT 222 02/11/2012 0827   MCV 91.4 02/11/2012 0827   MCH 29.6 02/11/2012 0827   MCHC 32.4 02/11/2012 0827   RDW 14.4 02/11/2012 0827      Assessment  Severe anemia and dysfunctional uterine bleeding with uterine fibroids, submucosal component. Anemia is reversed after treatment. Plan  Patient is scheduled for hysteroscopy resection of submucous fibroid and curettage for menorrhagia and anemia. The procedure and risks for anesthesia, bleeding, infection, pelvic organ damage and continued menorrhagia were explained and questions were answered. See note 01/30/12.   Lacey Wallman 02/11/2012

## 2012-02-11 NOTE — Progress Notes (Signed)
Translator used in [employer].  Eston Mould - translator amharic language

## 2012-02-11 NOTE — Anesthesia Preprocedure Evaluation (Addendum)
Anesthesia Evaluation Anesthesia Physical Anesthesia Plan  ASA: II  Anesthesia Plan: General   Post-op Pain Management:    Induction: Intravenous  Airway Management Planned: LMA  Additional Equipment:   Intra-op Plan:   Post-operative Plan:   Informed Consent: I have reviewed the patients History and Physical, chart, labs and discussed the procedure including the risks, benefits and alternatives for the proposed anesthesia with the patient or authorized representative who has indicated his/her understanding and acceptance.   Dental Advisory Given  Plan Discussed with: CRNA and Surgeon  Anesthesia Plan Comments: (  Discussed  general anesthesia, including possible nausea, instrumentation of airway, sore throat,pulmonary aspiration, etc. I asked if the were any outstanding questions, or  concerns before we proceeded. )        Anesthesia Quick Evaluation

## 2012-03-12 ENCOUNTER — Ambulatory Visit (INDEPENDENT_AMBULATORY_CARE_PROVIDER_SITE_OTHER): Payer: Self-pay | Admitting: Obstetrics & Gynecology

## 2012-03-12 ENCOUNTER — Encounter: Payer: Self-pay | Admitting: Obstetrics & Gynecology

## 2012-03-12 VITALS — BP 142/90 | HR 77 | Temp 99.4°F | Wt 189.2 lb

## 2012-03-12 DIAGNOSIS — Z09 Encounter for follow-up examination after completed treatment for conditions other than malignant neoplasm: Secondary | ICD-10-CM

## 2012-03-12 DIAGNOSIS — D259 Leiomyoma of uterus, unspecified: Secondary | ICD-10-CM

## 2012-03-12 NOTE — Progress Notes (Signed)
Here for post op visit. States has had cold like symptoms for a few days.

## 2012-03-12 NOTE — Patient Instructions (Signed)
Myomectomy Care After  Refer to this sheet in the next few weeks. These instructions provide you with information on caring for yourself after your procedure. Your caregiver may also give you specific instructions. Your treatment has been planned according to current medical practices, but problems sometimes occur. Call your caregiver if you have any problems or questions after your procedure. HOME CARE INSTRUCTIONS   Only take over-the-counter or prescription medicines for pain, discomfort, or fever as directed by your caregiver. Avoid aspirin because it can cause bleeding.  Do not douche, use tampons, or have intercourse until given permission to by your caregiver.  Change your bandage (dressing) as directed.  Do not drive until you are given permission to by your caregiver.  Take showers instead of baths as directed by your caregiver.  If you become constipated, you may take a mild laxative with your caregiver's permission. Eat more bran and drink enough fluids to keep your urine clear or pale yellow.  Take your temperature twice a day and write it down.  Do not drink alcohol.  Do not drive while on pain medicine (narcotics).  Have help at home for 1 week, or until you can do your own household activities.  Keep all follow-up appointments with your caregiver. SEEK MEDICAL CARE IF:  You develop a temperature of 100 F (37.8 C) or higher.  You have increasing stomach pain and medicine does not help.  You have nausea, vomiting, or diarrhea.  You have pain when you urinate, or you have blood in your urine.  You have a rash on your body.  You have pain or redness where your intravenous (IV) access tube was inserted.  You develop weakness or lightheadedness.  You need stronger pain medicine.  You have a reaction or side effects from your medicines. SEEK IMMEDIATE MEDICAL CARE IF:   You have pain, swelling, or any kind of drainage from your incision.  You have pain,  swelling, or redness in your leg.  You have chest pain.  You faint.  You have shortness of breath.  You have heavy vaginal bleeding.  You see pus coming from the incision.  Your incision is opening up. MAKE SURE YOU:  Understand these instructions.  Will watch your condition.  Will get help right away if you are not doing well or get worse. Document Released: 08/29/2010 Document Revised: 07/01/2011 Document Reviewed: 08/29/2010 ExitCare Patient Information 2013 [employer].  

## 2012-03-12 NOTE — Progress Notes (Signed)
  Subjective:    Patient ID: Caitlin Flowers, female    DOB: Oct 30, 1970, 41 y.o.   MRN: [id number]  [id number] Patient's last menstrual period was 03/05/2012. Hysteroscopic myomectomy 02/11/12 no complication and no complaints.    Review of Systems No vaginal bleeding    Objective:   Physical Exam  Constitutional: No distress.  Skin: Skin is warm and dry. No pallor.  Psychiatric: She has a normal mood and affect. Her behavior is normal.    Filed Vitals:   03/12/12 1046  BP: 142/90  Pulse: 77  Temp: 99.4 F (37.4 C)         Assessment & Plan:  Doing well postop from myomectomy. May finish her Provera now.  Aliyanah Rozas 03/12/2012 12:13 PM

## 2012-07-24 ENCOUNTER — Encounter: Payer: Self-pay | Admitting: Obstetrics & Gynecology

## 2012-07-24 ENCOUNTER — Ambulatory Visit (INDEPENDENT_AMBULATORY_CARE_PROVIDER_SITE_OTHER): Payer: Self-pay | Admitting: Obstetrics & Gynecology

## 2012-07-24 VITALS — BP 143/85 | HR 64 | Temp 98.6°F | Ht 63.0 in | Wt 179.2 lb

## 2012-07-24 DIAGNOSIS — D259 Leiomyoma of uterus, unspecified: Secondary | ICD-10-CM

## 2012-07-24 MED ORDER — IBUPROFEN 600 MG PO TABS: 600.0000 mg | ORAL_TABLET | Freq: Four times a day (QID) | ORAL | Status: DC | PRN

## 2012-07-24 NOTE — Progress Notes (Signed)
Still having some pain from surgery (had myomectomy in October). Got period 12/26-1/5 then no period in Feburary, next period was 3/24-3/29, other than that patient states no problems with period.

## 2012-07-24 NOTE — Progress Notes (Signed)
  Subjective:    Patient ID: Caitlin Flowers, female    DOB: 01-20-1971, 42 y.o.   MRN: [id number]  [id number] Patient's last menstrual period was 07/13/2012. Irregular menses now, lighter, lasts 5 days. S/P myomectomy. S/P pain on occasion, doesn't take pain medicine. Also concerned her husband has low libido.  Past Medical History  Diagnosis Date  . Fibroid (bleeding) (uterine) 2012  . SVD (spontaneous vaginal delivery)     x 2  . Anemia    History   Social History  . Marital Status: Married    Spouse Name: N/A    Number of Children: N/A  . Years of Education: N/A   Occupational History  . Not on file.   Social History Main Topics  . Smoking status: Never Smoker   . Smokeless tobacco: Never Used  . Alcohol Use: No  . Drug Use: No  . Sexually Active: Yes    Birth Control/ Protection: None   Other Topics Concern  . Not on file   Social History Narrative  . No narrative on file   No Known Allergies Past Surgical History  Procedure Laterality Date  . No past surgeries    . Wisdom tooth extraction    . Dilation and curettage of uterus  02/11/12    Hysteroscopy with versapoint resection/myomectomy/     Review of Systems  Genitourinary: Positive for pelvic pain. Negative for vaginal pain and menstrual problem.       Objective:   Physical Exam  Constitutional: No distress.  Pulmonary/Chest: Effort normal and breath sounds normal.  Abdominal: Soft. She exhibits no mass. There is no tenderness.  Genitourinary: Vagina normal and uterus normal. No vaginal discharge found.  Pap done no mass  Neurological: She is alert.  Skin: Skin is warm and dry.  Psychiatric: She has a normal mood and affect. Her behavior is normal.          Assessment & Plan:  H/O fibroids, now with pelvic pain   Repeat US  RTC 3 weeks Ibuprofen prn  Adam Phenix, MD 07/24/2012

## 2012-07-24 NOTE — Patient Instructions (Addendum)
Uterine Fibroid  A uterine fibroid is a growth (tumor) that occurs in a woman's uterus. This type of tumor is not cancerous and does not spread out of the uterus. A woman can have one or many fibroids, and the fiboid(s) can become quite large. A fibroid can vary in size, weight, and where it grows in the uterus. Most fibroids do not require medical treatment, but some can cause pain or heavy bleeding during and between periods.  CAUSES   A fibroid is the result of a single uterine cell that keeps growing (unregulated), which is different than most cells in the human body. Most cells have a control mechanism that keeps them from reproducing without control.   SYMPTOMS    Bleeding.   Pelvic pain and pressure.   Bladder problems due to the size of the fibroid.   Infertility and miscarriages depending on the size and location of the fibroid.  DIAGNOSIS   A diagnosis is made by physical exam. Your caregiver may feel the lumpy tumors during a pelvic exam. Important information regarding size, location, and number of tumors can be gained by having an ultrasound. It is rare that other tests, such as a CT scan or MRI, are needed.  TREATMENT    Your caregiver may recommend watchful waiting. This involves getting the fibroid checked by your caregiver to see if the fibroids grow or shrink.    Hormonal treatment or an intrauterine device (IUD) may be prescribed.    Surgery may be needed to remove the fibroids (myomectomy) or the uterus (hysterectomy). This depends on your situation.  When fibroids interfere with fertility and a woman wants to become pregnant, a caregiver may recommend having the fibroids removed.   HOME CARE INSTRUCTIONS   Home care depends on how you were treated. In general:    Keep all follow-up appointments with your caregiver.    Only take medicine as told by your caregiver. Do not take aspirin. It can cause bleeding.    If you have excessive periods and soak tampons or pads in a half hour or  less, contact your caregiver immediately. If your periods are troublesome but not so heavy, lie down with your feet raised slightly above your heart. Place cold packs on your lower abdomen.    If your periods are heavy, write down the number of pads or tampons you use per month. Bring this information to your caregiver.    Talk to your caregiver about taking iron pills.    Include green vegetables in your diet.    If you were prescribed a hormonal treatment, take the hormonal medicines as directed.    If you need surgery, ask your caregiver for information on your specific surgery.   SEEK IMMEDIATE MEDICAL CARE IF:   You have pelvic pain or cramps not controlled with medicines.    You have a sudden increase in pelvic pain.    You have an increase of bleeding between and during periods.    You feel lightheaded or have fainting episodes.   MAKE SURE YOU:   Understand these instructions.   Will watch your condition.   Will get help right away if you are not doing well or get worse.  Document Released: 04/05/2000 Document Revised: 07/01/2011 Document Reviewed: 04/29/2011  ExitCare Patient Information 2013 [employer].

## 2012-07-29 ENCOUNTER — Ambulatory Visit (HOSPITAL_COMMUNITY): Payer: BC Managed Care – PPO | Attending: Obstetrics & Gynecology

## 2012-07-30 ENCOUNTER — Telehealth: Payer: Self-pay

## 2012-07-30 NOTE — Telephone Encounter (Addendum)
Called pt with [employer] # [phone] and called the [phone] and stated "subscriber is not in service".  Called the [phone] with interpreter and I had to call back.  Called pt again with [facility] interpreter # [phone] and called [phone] and was unable to leave message.  A certified letter has been sent to the patient.

## 2012-08-27 ENCOUNTER — Encounter: Payer: BC Managed Care – PPO | Admitting: Obstetrics & Gynecology

## 2013-01-11 ENCOUNTER — Emergency Department (INDEPENDENT_AMBULATORY_CARE_PROVIDER_SITE_OTHER)
Admission: EM | Admit: 2013-01-11 | Discharge: 2013-01-11 | Disposition: A | Discharge status: Home / Self Care | Payer: Self-pay | Source: Home / Self Care | Attending: Family Medicine | Admitting: Family Medicine

## 2013-01-11 ENCOUNTER — Encounter (HOSPITAL_COMMUNITY): Payer: Self-pay | Admitting: Emergency Medicine

## 2013-01-11 ENCOUNTER — Emergency Department (INDEPENDENT_AMBULATORY_CARE_PROVIDER_SITE_OTHER): Payer: Self-pay

## 2013-01-11 DIAGNOSIS — S61209A Unspecified open wound of unspecified finger without damage to nail, initial encounter: Secondary | ICD-10-CM

## 2013-01-11 MED ORDER — HYDROCODONE-ACETAMINOPHEN 5-325 MG PO TABS: 1.0000 | ORAL_TABLET | ORAL | Status: DC | PRN

## 2013-01-11 MED ORDER — HYDROCODONE-ACETAMINOPHEN 5-325 MG PO TABS
2.0000 | ORAL_TABLET | Freq: Once | ORAL | Status: AC
  Administered 2013-01-11: 2 via ORAL

## 2013-01-11 MED ORDER — HYDROCODONE-ACETAMINOPHEN 5-325 MG PO TABS
ORAL_TABLET | ORAL | Status: AC
  Filled 2013-01-11: qty 2

## 2013-01-11 NOTE — ED Notes (Signed)
Reports injury to right index finger about 20 mins ago. States using meat cleaver and finger got caught in Programme researcher, broadcasting/film/video.   Index finger is wrapped still having some bleeding.  Pt states that she is up to date on shots.

## 2013-01-11 NOTE — ED Provider Notes (Signed)
CSN: [id number]     Arrival date & time 01/11/13  1525 History   First MD Initiated Contact with Patient 01/11/13 1722     Chief Complaint  Patient presents with  . Hand Injury    injury to right index finger about 20 mins ago   (Consider location/radiation/quality/duration/timing/severity/associated sxs/prior Treatment) HPI Comments: Pt cut off end of finger with meat cleaver just prior to arrival  Patient is a 42 y.o. female presenting with hand injury. The history is provided by the patient.  Hand Injury Location:  Finger Time since incident: just prior to arrival. Injury: yes   Mechanism of injury: amputation   Amputation:    Mechanism:  Avulsion Finger location:  R index finger and R middle finger Pain details:    Quality:  Throbbing   Radiates to:  Does not radiate   Severity:  Severe   Onset quality:  Sudden   Timing:  Constant   Progression:  Unchanged Chronicity:  New Tetanus status:  Up to date Relieved by:  Nothing Worsened by:  Nothing tried Ineffective treatments: direct pressure. Associated symptoms: no numbness   Associated symptoms comment:  Bleeding   Past Medical History  Diagnosis Date  . Fibroid (bleeding) (uterine) 2012  . SVD (spontaneous vaginal delivery)     x 2  . Anemia    Past Surgical History  Procedure Laterality Date  . No past surgeries    . Wisdom tooth extraction    . Dilation and curettage of uterus  02/11/12    Hysteroscopy with versapoint resection/myomectomy/   Family History  Problem Relation Age of Onset  . Hypertension Mother    History  Substance Use Topics  . Smoking status: Never Smoker   . Smokeless tobacco: Never Used  . Alcohol Use: No   OB History   Grav Para Term Preterm Abortions TAB SAB Ect Mult Living   4 2 2  2  2   2      Review of Systems  Musculoskeletal:       R middle and R index finger injuries from meat cleaver  Skin: Positive for wound.    Allergies  Review of patient's allergies  indicates no known allergies.  Home Medications   Current Outpatient Rx  Name  Route  Sig  Dispense  Refill  . Fe Fum-FePoly-Vit C-Vit B3 (INTEGRA) 62.5-62.5-40-3 MG CAPS   Oral   Take 1 tablet by mouth 2 (two) times daily.   30 capsule   2   . HYDROcodone-acetaminophen (NORCO/VICODIN) 5-325 MG per tablet   Oral   Take 1 tablet by mouth every 4 (four) hours as needed for pain.   20 tablet   0   . HYDROcodone-acetaminophen (VICODIN) 5-500 MG per tablet   Oral   Take 1 tablet by mouth every 6 (six) hours as needed for pain.   15 tablet   0   . ibuprofen (ADVIL,MOTRIN) 600 MG tablet   Oral   Take 1 tablet (600 mg total) by mouth every 6 (six) hours as needed for pain.   30 tablet   1   . medroxyPROGESTERone (PROVERA) 5 MG tablet   Oral   Take 1 tablet (5 mg total) by mouth daily.   20 tablet   0    BP 160/79  Pulse 70  Temp(Src) 98.1 F (36.7 C) (Oral)  Resp 18  SpO2 100%  LMP 12/11/2012 Physical Exam  Constitutional: She appears well-developed and well-nourished. No distress.  Skin: Skin  is warm and dry. Laceration noted.  Dorsal distal phalanx area of R index finger has been avulsed by meat cleaver; nail and nail bed are missing; area is bleeding. Area is approximately 1.5cm x 2cm. Separate avulsion injury on R dorsal middle finger adjacent to nail but not including nail on lateral side, approx 2mm by 1cm in size.     ED Course  Procedures (including critical care time) Labs Review Labs Reviewed - No data to display Imaging Review Dg Finger Index Right  01/11/2013   CLINICAL DATA:  Laceration.  EXAM: RIGHT INDEX FINGER 2+V  COMPARISON:  None  FINDINGS: No obvious fracture or radiopaque foreign body. The index and long fingers are covered with a bandages.  IMPRESSION: No definite fracture or radiopaque foreign body.   Electronically Signed   By: Loralie Champagne M.D.   On: 01/11/2013 17:09    MDM   1. Avulsion of skin of finger, initial encounter   xeroform,  gauze and coban applied by tech to both wounds. Discussed pt with Dr. Ronie Spies asst, Larita Fife.  Pt is to go to Dr. Ronie Spies office for assistance tomorrow at 1:30pm. Pt was to bring copy of x-ray on CD with her but we were unable to provide one.     Cathlyn Parsons, NP 01/11/13 1749

## 2013-01-12 ENCOUNTER — Other Ambulatory Visit: Payer: Self-pay | Admitting: Orthopedic Surgery

## 2013-01-12 ENCOUNTER — Encounter (HOSPITAL_BASED_OUTPATIENT_CLINIC_OR_DEPARTMENT_OTHER): Payer: Self-pay | Admitting: *Deleted

## 2013-01-12 NOTE — Progress Notes (Signed)
No interpretor for amharit-daughter with pt-speaks fairly well english-able to translate for pt-she will also come to surgery with her-no on site interpretor available for this language.fron [place]

## 2013-01-13 ENCOUNTER — Encounter (HOSPITAL_BASED_OUTPATIENT_CLINIC_OR_DEPARTMENT_OTHER): Payer: Self-pay | Admitting: Anesthesiology

## 2013-01-13 ENCOUNTER — Ambulatory Visit (HOSPITAL_BASED_OUTPATIENT_CLINIC_OR_DEPARTMENT_OTHER): Payer: Self-pay | Admitting: Anesthesiology

## 2013-01-13 ENCOUNTER — Ambulatory Visit (HOSPITAL_BASED_OUTPATIENT_CLINIC_OR_DEPARTMENT_OTHER)
Admission: RE | Admit: 2013-01-13 | Discharge: 2013-01-13 | Disposition: A | Discharge status: Home / Self Care | Payer: Self-pay | Source: Ambulatory Visit | Attending: Orthopedic Surgery | Admitting: Orthopedic Surgery

## 2013-01-13 ENCOUNTER — Encounter (HOSPITAL_BASED_OUTPATIENT_CLINIC_OR_DEPARTMENT_OTHER)
Admission: RE | Disposition: A | Discharge status: Home / Self Care | Payer: Self-pay | Source: Ambulatory Visit | Attending: Orthopedic Surgery

## 2013-01-13 DIAGNOSIS — X58XXXA Exposure to other specified factors, initial encounter: Secondary | ICD-10-CM | POA: Insufficient documentation

## 2013-01-13 DIAGNOSIS — M79609 Pain in unspecified limb: Secondary | ICD-10-CM | POA: Insufficient documentation

## 2013-01-13 DIAGNOSIS — S61209A Unspecified open wound of unspecified finger without damage to nail, initial encounter: Secondary | ICD-10-CM | POA: Insufficient documentation

## 2013-01-13 DIAGNOSIS — S6981XA Other specified injuries of right wrist, hand and finger(s), initial encounter: Secondary | ICD-10-CM

## 2013-01-13 HISTORY — DX: Presence of spectacles and contact lenses: Z97.3

## 2013-01-13 HISTORY — PX: INCISION AND DRAINAGE OF WOUND: SHX1803

## 2013-01-13 SURGERY — IRRIGATION AND DEBRIDEMENT WOUND
Anesthesia: General | Site: Finger | Laterality: Right | Wound class: Clean Contaminated

## 2013-01-13 MED ORDER — MIDAZOLAM HCL 2 MG/2ML IJ SOLN: 1.0000 mg | INTRAMUSCULAR | Status: DC | PRN

## 2013-01-13 MED ORDER — FENTANYL CITRATE 0.05 MG/ML IJ SOLN: 50.0000 ug | INTRAMUSCULAR | Status: DC | PRN

## 2013-01-13 MED ORDER — FENTANYL CITRATE 0.05 MG/ML IJ SOLN
INTRAMUSCULAR | Status: DC | PRN
  Administered 2013-01-13 (×2): 50 ug via INTRAVENOUS
  Administered 2013-01-13: 25 ug via INTRAVENOUS

## 2013-01-13 MED ORDER — DEXAMETHASONE SODIUM PHOSPHATE 10 MG/ML IJ SOLN
INTRAMUSCULAR | Status: DC | PRN
  Administered 2013-01-13: 10 mg via INTRAVENOUS

## 2013-01-13 MED ORDER — BUPIVACAINE HCL (PF) 0.25 % IJ SOLN
INTRAMUSCULAR | Status: DC | PRN
  Administered 2013-01-13: 4 mL

## 2013-01-13 MED ORDER — LACTATED RINGERS IV SOLN
INTRAVENOUS | Status: DC
  Administered 2013-01-13 (×2): via INTRAVENOUS

## 2013-01-13 MED ORDER — PROMETHAZINE HCL 25 MG/ML IJ SOLN: 6.2500 mg | INTRAMUSCULAR | Status: DC | PRN

## 2013-01-13 MED ORDER — LIDOCAINE HCL (CARDIAC) 20 MG/ML IV SOLN
INTRAVENOUS | Status: DC | PRN
  Administered 2013-01-13: 60 mg via INTRAVENOUS

## 2013-01-13 MED ORDER — MIDAZOLAM HCL 5 MG/5ML IJ SOLN
INTRAMUSCULAR | Status: DC | PRN
  Administered 2013-01-13: 2 mg via INTRAVENOUS

## 2013-01-13 MED ORDER — FENTANYL CITRATE 0.05 MG/ML IJ SOLN
50.0000 ug | Freq: Once | INTRAMUSCULAR | Status: DC
Start: 2013-01-13 — End: 2013-01-13

## 2013-01-13 MED ORDER — OXYCODONE-ACETAMINOPHEN 5-325 MG PO TABS: 1.0000 | ORAL_TABLET | ORAL | Status: DC | PRN

## 2013-01-13 MED ORDER — PROPOFOL 10 MG/ML IV BOLUS
INTRAVENOUS | Status: DC | PRN
  Administered 2013-01-13: 200 mg via INTRAVENOUS

## 2013-01-13 MED ORDER — HYDROMORPHONE HCL PF 1 MG/ML IJ SOLN
0.2500 mg | INTRAMUSCULAR | Status: DC | PRN
  Administered 2013-01-13 (×3): 0.5 mg via INTRAVENOUS

## 2013-01-13 MED ORDER — OXYCODONE HCL 5 MG PO TABS
5.0000 mg | ORAL_TABLET | Freq: Once | ORAL | Status: AC | PRN
  Administered 2013-01-13: 5 mg via ORAL

## 2013-01-13 MED ORDER — ONDANSETRON HCL 4 MG/2ML IJ SOLN
INTRAMUSCULAR | Status: DC | PRN
  Administered 2013-01-13: 4 mg via INTRAVENOUS

## 2013-01-13 MED ORDER — CEFAZOLIN SODIUM-DEXTROSE 2-3 GM-% IV SOLR
INTRAVENOUS | Status: DC | PRN
  Administered 2013-01-13: 2 g via INTRAVENOUS

## 2013-01-13 MED ORDER — OXYCODONE HCL 5 MG/5ML PO SOLN: 5.0000 mg | Freq: Once | ORAL | Status: AC | PRN

## 2013-01-13 MED ORDER — CHLORHEXIDINE GLUCONATE 4 % EX LIQD: 60.0000 mL | Freq: Once | CUTANEOUS | Status: DC

## 2013-01-13 SURGICAL SUPPLY — 63 items
APL SKNCLS STERI-STRIP NONHPOA (GAUZE/BANDAGES/DRESSINGS) ×1
BAG DECANTER FOR FLEXI CONT (MISCELLANEOUS) IMPLANT
BANDAGE ELASTIC 3 VELCRO ST LF (GAUZE/BANDAGES/DRESSINGS) ×2 IMPLANT
BANDAGE ELASTIC 4 VELCRO ST LF (GAUZE/BANDAGES/DRESSINGS) IMPLANT
BANDAGE GAUZE ELAST BULKY 4 IN (GAUZE/BANDAGES/DRESSINGS) IMPLANT
BENZOIN TINCTURE PRP APPL 2/3 (GAUZE/BANDAGES/DRESSINGS) ×2 IMPLANT
BLADE SURG 15 STRL LF DISP TIS (BLADE) ×1 IMPLANT
BLADE SURG 15 STRL SS (BLADE) ×2
BNDG CMPR 9X4 STRL LF SNTH (GAUZE/BANDAGES/DRESSINGS) ×1
BNDG COHESIVE 1X5 TAN STRL LF (GAUZE/BANDAGES/DRESSINGS) ×2 IMPLANT
BNDG ESMARK 4X9 LF (GAUZE/BANDAGES/DRESSINGS) ×2 IMPLANT
CLOTH BEACON ORANGE TIMEOUT ST (SAFETY) ×2 IMPLANT
CORDS BIPOLAR (ELECTRODE) ×2 IMPLANT
COVER TABLE BACK 60X90 (DRAPES) ×2 IMPLANT
CUFF TOURNIQUET SINGLE 18IN (TOURNIQUET CUFF) IMPLANT
DECANTER SPIKE VIAL GLASS SM (MISCELLANEOUS) IMPLANT
DRAPE EXTREMITY T 121X128X90 (DRAPE) ×2 IMPLANT
DRAPE SURG 17X23 STRL (DRAPES) ×2 IMPLANT
DURAPREP 26ML APPLICATOR (WOUND CARE) ×2 IMPLANT
GAUZE PACKING IODOFORM 1/4X5 (PACKING) IMPLANT
GAUZE XEROFORM 1X8 LF (GAUZE/BANDAGES/DRESSINGS) ×2 IMPLANT
GLOVE BIO SURGEON STRL SZ 6.5 (GLOVE) ×4 IMPLANT
GLOVE BIO SURGEON STRL SZ8 (GLOVE) ×2 IMPLANT
GOWN BRE IMP PREV XXLGXLNG (GOWN DISPOSABLE) ×2 IMPLANT
GOWN PREVENTION PLUS XLARGE (GOWN DISPOSABLE) ×2 IMPLANT
HANDPIECE INTERPULSE COAX TIP (DISPOSABLE)
IV NS IRRIG 3000ML ARTHROMATIC (IV SOLUTION) IMPLANT
LOOP VESSEL MAXI BLUE (MISCELLANEOUS) IMPLANT
NEEDLE 27GAX1X1/2 (NEEDLE) ×2 IMPLANT
NEEDLE HYPO 25X1 1.5 SAFETY (NEEDLE) IMPLANT
NS IRRIG 1000ML POUR BTL (IV SOLUTION) ×2 IMPLANT
PACK BASIN DAY SURGERY FS (CUSTOM PROCEDURE TRAY) ×2 IMPLANT
PAD CAST 3X4 CTTN HI CHSV (CAST SUPPLIES) ×1 IMPLANT
PADDING CAST ABS 3INX4YD NS (CAST SUPPLIES) ×1
PADDING CAST ABS 4INX4YD NS (CAST SUPPLIES)
PADDING CAST ABS COTTON 3X4 (CAST SUPPLIES) ×1 IMPLANT
PADDING CAST ABS COTTON 4X4 ST (CAST SUPPLIES) IMPLANT
PADDING CAST COTTON 3X4 STRL (CAST SUPPLIES) ×2
SET HNDPC FAN SPRY TIP SCT (DISPOSABLE) IMPLANT
SHEET MEDIUM DRAPE 40X70 STRL (DRAPES) ×2 IMPLANT
SPLINT PLASTER CAST XFAST 3X15 (CAST SUPPLIES) IMPLANT
SPLINT PLASTER CAST XFAST 4X15 (CAST SUPPLIES) ×5 IMPLANT
SPLINT PLASTER XTRA FAST SET 4 (CAST SUPPLIES) ×5
SPLINT PLASTER XTRA FASTSET 3X (CAST SUPPLIES)
SPONGE GAUZE 4X4 12PLY (GAUZE/BANDAGES/DRESSINGS) ×2 IMPLANT
STOCKINETTE 4X48 STRL (DRAPES) ×2 IMPLANT
STRIP CLOSURE SKIN 1/2X4 (GAUZE/BANDAGES/DRESSINGS) ×2 IMPLANT
SUT CHROMIC 6 0 G 1 (SUTURE) IMPLANT
SUT ETHILON 3 0 PS 1 (SUTURE) IMPLANT
SUT ETHILON 5 0 PS 2 18 (SUTURE) IMPLANT
SUT PROLENE 3 0 PS 2 (SUTURE) IMPLANT
SUT VIC AB 4-0 P-3 18XBRD (SUTURE) IMPLANT
SUT VIC AB 4-0 P3 18 (SUTURE)
SUT VICRYL RAPIDE 4/0 PS 2 (SUTURE) ×6 IMPLANT
SWAB COLLECTION DEVICE MRSA (MISCELLANEOUS) IMPLANT
SYR BULB 3OZ (MISCELLANEOUS) ×2 IMPLANT
SYR CONTROL 10ML LL (SYRINGE) IMPLANT
SYRINGE 10CC LL (SYRINGE) ×2 IMPLANT
TOWEL OR 17X24 6PK STRL BLUE (TOWEL DISPOSABLE) ×4 IMPLANT
TUBE ANAEROBIC SPECIMEN COL (MISCELLANEOUS) IMPLANT
TUBE CONNECTING 20X1/4 (TUBING) IMPLANT
UNDERPAD 30X30 INCONTINENT (UNDERPADS AND DIAPERS) ×2 IMPLANT
YANKAUER SUCT BULB TIP NO VENT (SUCTIONS) IMPLANT

## 2013-01-13 NOTE — Transfer of Care (Signed)
Immediate Anesthesia Transfer of Care Note  Patient: Caitlin Flowers  Procedure(s) Performed: Procedure(s): IRRIGATION AND DEBRIDEMENT OF NAIL BED ABLATION AND FULL THICKNESS SKIN GRAFT  (Right)  Patient Location: PACU  Anesthesia Type:General  Level of Consciousness: awake, alert , oriented and patient cooperative  Airway & Oxygen Therapy: Patient Spontanous Breathing and Patient connected to face mask oxygen  Post-op Assessment: Report given to PACU RN and Post -op Vital signs reviewed and stable  Post vital signs: Reviewed and stable  Complications: No apparent anesthesia complications

## 2013-01-13 NOTE — H&P (Signed)
Caitlin Flowers is an 42 y.o. female.   Chief Complaint: right index pain and deformity HPI: as above with traumatic soft tissue loss to dorsal aspect of index distally with exposed bone  Past Medical History  Diagnosis Date  . Fibroid (bleeding) (uterine) 2012  . SVD (spontaneous vaginal delivery)     x 2  . Anemia   . Wears glasses     to read    Past Surgical History  Procedure Laterality Date  . No past surgeries    . Wisdom tooth extraction    . Dilation and curettage of uterus  02/11/12    Hysteroscopy with versapoint resection/myomectomy/    Family History  Problem Relation Age of Onset  . Hypertension Mother    Social History:  reports that she has never smoked. She has never used smokeless tobacco. She reports that she does not drink alcohol or use illicit drugs.  Allergies: No Known Allergies  No prescriptions prior to admission    No results found for this or any previous visit (from the past 48 hour(s)). Dg Finger Index Right  01/11/2013   CLINICAL DATA:  Laceration.  EXAM: RIGHT INDEX FINGER 2+V  COMPARISON:  None  FINDINGS: No obvious fracture or radiopaque foreign body. The index and long fingers are covered with a bandages.  IMPRESSION: No definite fracture or radiopaque foreign body.   Electronically Signed   By: Loralie Champagne M.D.   On: 01/11/2013 17:09    Review of Systems  All other systems reviewed and are negative.    Height 5\' 3"  (1.6 m), weight 80.74 kg (178 lb), last menstrual period 12/17/2012. Physical Exam  Constitutional: She is oriented to person, place, and time. She appears well-developed and well-nourished.  HENT:  Head: Normocephalic and atraumatic.  Cardiovascular: Normal rate.   Respiratory: Effort normal.  Musculoskeletal:       Right hand: She exhibits bony tenderness, deformity and laceration.  Right index soft tissue loss dorsally with exposed bone  Neurological: She is alert and oriented to person, place, and time.  Skin:  Skin is warm.  Psychiatric: She has a normal mood and affect. Her behavior is normal. Judgment and thought content normal.     Assessment/Plan As above  Plan nail bed ablation with FTSG  Caitlin Flowers A 01/13/2013, 6:24 AM

## 2013-01-13 NOTE — Anesthesia Procedure Notes (Signed)
Procedure Name: LMA Insertion Date/Time: 01/13/2013 3:58 PM Performed by: Camelia Stelzner D Pre-anesthesia Checklist: Patient identified, Emergency Drugs available, Suction available and Patient being monitored Patient Re-evaluated:Patient Re-evaluated prior to inductionOxygen Delivery Method: Circle System Utilized Preoxygenation: Pre-oxygenation with 100% oxygen Intubation Type: IV induction Ventilation: Mask ventilation without difficulty LMA: LMA inserted LMA Size: 4.0 Number of attempts: 1 Airway Equipment and Method: bite block Placement Confirmation: positive ETCO2 Tube secured with: Tape Dental Injury: Teeth and Oropharynx as per pre-operative assessment

## 2013-01-13 NOTE — ED Provider Notes (Signed)
Medical screening examination/treatment/procedure(s) were performed by resident physician or non-physician practitioner and as supervising physician I was immediately available for consultation/collaboration.   Lilyahna Sirmon DOUGLAS MD.   Reyne Falconi D Shevon Sian, MD 01/13/13 1519 

## 2013-01-13 NOTE — Op Note (Signed)
Se note [id number]

## 2013-01-13 NOTE — Anesthesia Postprocedure Evaluation (Signed)
  Anesthesia Post-op Note  Patient: Caitlin Flowers  Procedure(s) Performed: Procedure(s): IRRIGATION AND DEBRIDEMENT OF NAIL BED ABLATION AND FULL THICKNESS SKIN GRAFT  (Right)  Patient Location: PACU  Anesthesia Type:General  Level of Consciousness: awake and alert   Airway and Oxygen Therapy: Patient Spontanous Breathing  Post-op Pain: mild  Post-op Assessment: Post-op Vital signs reviewed, Patient's Cardiovascular Status Stable, Respiratory Function Stable, Patent Airway, No signs of Nausea or vomiting and Pain level controlled  Post-op Vital Signs: Reviewed and stable  Complications: No apparent anesthesia complications

## 2013-01-13 NOTE — Anesthesia Preprocedure Evaluation (Signed)
Anesthesia Evaluation  Patient identified by MRN, date of birth, ID band Patient awake    Reviewed: Allergy & Precautions, H&P , NPO status , Patient's Chart, lab work & pertinent test results  Airway Mallampati: I TM Distance: >3 FB Neck ROM: Full    Dental   Pulmonary  breath sounds clear to auscultation        Cardiovascular Rhythm:Regular Rate:Normal     Neuro/Psych    GI/Hepatic   Endo/Other    Renal/GU      Musculoskeletal   Abdominal (+) + obese,   Peds  Hematology   Anesthesia Other Findings Anemia  Reproductive/Obstetrics                           Anesthesia Physical Anesthesia Plan  ASA: I  Anesthesia Plan: General   Post-op Pain Management:    Induction: Intravenous  Airway Management Planned: LMA  Additional Equipment:   Intra-op Plan:   Post-operative Plan: Extubation in OR  Informed Consent: I have reviewed the patients History and Physical, chart, labs and discussed the procedure including the risks, benefits and alternatives for the proposed anesthesia with the patient or authorized representative who has indicated his/her understanding and acceptance.     Plan Discussed with: CRNA and Surgeon  Anesthesia Plan Comments:         Anesthesia Quick Evaluation

## 2013-01-14 ENCOUNTER — Encounter (HOSPITAL_BASED_OUTPATIENT_CLINIC_OR_DEPARTMENT_OTHER): Payer: Self-pay | Admitting: Orthopedic Surgery

## 2013-01-14 NOTE — Op Note (Signed)
NAMELEYLANIE, Caitlin Flowers                ACCOUNT NO.:  [id number]  MEDICAL RECORD NO.:  [id number]  LOCATION:                               FACILITY:  [facility]  PHYSICIAN:  Artist Pais. Jamason Peckham, M.D.DATE OF BIRTH:  1971-01-10  DATE OF PROCEDURE:  01/13/2013 DATE OF DISCHARGE:  01/13/2013                              OPERATIVE REPORT   PREOPERATIVE DIAGNOSIS:  Right index finger traumatic nail plate and nail bed avulsion with exposed distal phalangeal bone.  POSTOPERATIVE DIAGNOSIS:  Right index finger traumatic nail plate and nail bed avulsion with exposed distal phalangeal bone.  PROCEDURE:  Nail bed ablation and full-thickness skin grafting from hypothenar eminence.  SURGEON:  Artist Pais. Mina Marble, M.D.  ASSISTANT:  None.  ANESTHESIA:  General.  COMPLICATIONS:  None.  DRAINS:  None.  SPECIMENS:  None.  DESCRIPTION OF PROCEDURE:  The patient was taken to the operating suite. After induction of general anesthesia, right upper extremity was prepped and draped in sterile fashion.  An Esmarch was used to exsanguinate the limb.  Tourniquet was inflated to 250 mmHg.  At this point in time, the dorsal aspect of right index finger was inspected.  There was complete loss of the majority of the germinal matrix with a small bit along the radial side still evident with a small bit of nail plate.  The sterile matrix distally was intact to the distal third.  There was exposed distal phalangeal bone.  The wound was irrigated.  The remaining germinal matrix was carefully excised with a 15 blade.  Once all this had been removed, the wound was irrigated.  The hand was then fully supinated and a full-thickness skin graft and hypothenar eminence was harvested with no undue difficulty.  Wound was irrigated and the donor site was closed with 4-0 Vicryl Rapide followed by benzoin and Steri- Strips.  The full-thickness skin graft was then sutured down to the distal aspect of right index finger covering  the exposed distal phalangeal bone and remaining nail bed.  This was done with 4-0 Vicryl Rapide suture.  Once this was completed, the graft site was dressed with Xeroform and moist cotton ball and a compressive bolster type dressing with 4x4s and Coban wrap.  The hand was then dressed with 4x4s fluffs and a compression bandage.  The patient tolerated the procedure well in a concealed fashion.     Artist Pais Mina Marble, M.D.     MAW/MEDQ  D:  01/13/2013  T:  01/14/2013  Job:  [id number]

## 2013-03-25 ENCOUNTER — Encounter (HOSPITAL_COMMUNITY): Payer: Self-pay | Admitting: Emergency Medicine

## 2013-03-25 ENCOUNTER — Emergency Department (HOSPITAL_COMMUNITY)
Admission: EM | Admit: 2013-03-25 | Discharge: 2013-03-25 | Disposition: A | Discharge status: Home / Self Care | Payer: Self-pay | Attending: Emergency Medicine | Admitting: Emergency Medicine

## 2013-03-25 ENCOUNTER — Emergency Department (HOSPITAL_COMMUNITY): Payer: Self-pay

## 2013-03-25 ENCOUNTER — Emergency Department (HOSPITAL_COMMUNITY): Payer: No Typology Code available for payment source

## 2013-03-25 DIAGNOSIS — S8990XA Unspecified injury of unspecified lower leg, initial encounter: Secondary | ICD-10-CM | POA: Insufficient documentation

## 2013-03-25 DIAGNOSIS — Z862 Personal history of diseases of the blood and blood-forming organs and certain disorders involving the immune mechanism: Secondary | ICD-10-CM | POA: Insufficient documentation

## 2013-03-25 DIAGNOSIS — S79919A Unspecified injury of unspecified hip, initial encounter: Secondary | ICD-10-CM | POA: Insufficient documentation

## 2013-03-25 DIAGNOSIS — S99912A Unspecified injury of left ankle, initial encounter: Secondary | ICD-10-CM

## 2013-03-25 DIAGNOSIS — M62838 Other muscle spasm: Secondary | ICD-10-CM | POA: Insufficient documentation

## 2013-03-25 DIAGNOSIS — Y9389 Activity, other specified: Secondary | ICD-10-CM | POA: Insufficient documentation

## 2013-03-25 DIAGNOSIS — S0990XA Unspecified injury of head, initial encounter: Secondary | ICD-10-CM | POA: Insufficient documentation

## 2013-03-25 DIAGNOSIS — Z8742 Personal history of other diseases of the female genital tract: Secondary | ICD-10-CM | POA: Insufficient documentation

## 2013-03-25 DIAGNOSIS — Y92410 Unspecified street and highway as the place of occurrence of the external cause: Secondary | ICD-10-CM | POA: Insufficient documentation

## 2013-03-25 DIAGNOSIS — IMO0002 Reserved for concepts with insufficient information to code with codable children: Secondary | ICD-10-CM | POA: Insufficient documentation

## 2013-03-25 MED ORDER — HYDROMORPHONE HCL PF 1 MG/ML IJ SOLN
1.0000 mg | Freq: Once | INTRAMUSCULAR | Status: AC
  Administered 2013-03-25: 1 mg via INTRAVENOUS
  Filled 2013-03-25: qty 1

## 2013-03-25 MED ORDER — NAPROXEN 500 MG PO TABS: 500.0000 mg | ORAL_TABLET | Freq: Two times a day (BID) | ORAL | Status: DC

## 2013-03-25 MED ORDER — OXYCODONE-ACETAMINOPHEN 5-325 MG PO TABS: 1.0000 | ORAL_TABLET | ORAL | Status: DC | PRN
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
## 2013-03-25 NOTE — ED Provider Notes (Signed)
Medical screening examination/treatment/procedure(s) were performed by non-physician practitioner and as supervising physician I was immediately available for consultation/collaboration.  Flint Melter, MD 03/25/13 1700

## 2013-03-25 NOTE — ED Notes (Signed)
Family at bedside. 
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
## 2014-02-21 ENCOUNTER — Encounter (HOSPITAL_COMMUNITY): Payer: Self-pay | Admitting: Emergency Medicine

## 2014-09-21 ENCOUNTER — Encounter (HOSPITAL_COMMUNITY): Payer: Self-pay | Admitting: *Deleted

## 2014-09-21 ENCOUNTER — Emergency Department (HOSPITAL_BASED_OUTPATIENT_CLINIC_OR_DEPARTMENT_OTHER)
Admission: RE | Admit: 2014-09-21 | Discharge: 2014-09-21 | Disposition: A | Discharge status: Home / Self Care | Payer: No Typology Code available for payment source | Source: Ambulatory Visit | Attending: Emergency Medicine | Admitting: Emergency Medicine

## 2014-09-21 ENCOUNTER — Emergency Department (HOSPITAL_COMMUNITY)
Admission: EM | Admit: 2014-09-21 | Discharge: 2014-09-21 | Disposition: A | Discharge status: Home / Self Care | Payer: No Typology Code available for payment source | Attending: Emergency Medicine | Admitting: Emergency Medicine

## 2014-09-21 ENCOUNTER — Emergency Department (HOSPITAL_COMMUNITY): Payer: No Typology Code available for payment source

## 2014-09-21 DIAGNOSIS — M79605 Pain in left leg: Secondary | ICD-10-CM | POA: Insufficient documentation

## 2014-09-21 DIAGNOSIS — M79609 Pain in unspecified limb: Secondary | ICD-10-CM | POA: Diagnosis not present

## 2014-09-21 DIAGNOSIS — Z862 Personal history of diseases of the blood and blood-forming organs and certain disorders involving the immune mechanism: Secondary | ICD-10-CM | POA: Diagnosis not present

## 2014-09-21 DIAGNOSIS — Z791 Long term (current) use of non-steroidal anti-inflammatories (NSAID): Secondary | ICD-10-CM | POA: Diagnosis not present

## 2014-09-21 DIAGNOSIS — Z86018 Personal history of other benign neoplasm: Secondary | ICD-10-CM | POA: Insufficient documentation

## 2014-09-21 MED ORDER — NAPROXEN 500 MG PO TABS: 500.0000 mg | ORAL_TABLET | Freq: Two times a day (BID) | ORAL | Status: DC

## 2014-09-21 NOTE — ED Notes (Signed)
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

## 2014-09-21 NOTE — Discharge Instructions (Signed)

## 2014-09-21 NOTE — ED Provider Notes (Signed)
CSN: [id number]     Arrival date & time 09/21/14  1027 History  This chart was scribed for Marcene Brawn, PA-C, working with Carmin Muskrat, MD by Starleen Arms, ED Scribe. This patient was seen in room TR08C/TR08C and the patient's care was started at 12:00 PM.   Chief Complaint  Patient presents with  . Leg Pain   The history is provided by the patient. No language interpreter was used.  HPI Comments: Caitlin Flowers is a 44 y.o. female who presents to the Emergency Department complaining of left leg pain and swelling below the knee without injury.  She denies history of chronic medical conditions including DM and HTN.  The patient denies recent travel, prolonged periods of immobilization or sitting.  Patient does not use any birth control medications.  She denies previous similar episodes.  She does not smoke.   PCP: none Past Medical History  Diagnosis Date  . Fibroid (bleeding) (uterine) 2012  . SVD (spontaneous vaginal delivery)     x 2  . Anemia   . Wears glasses     to read   Past Surgical History  Procedure Laterality Date  . No past surgeries    . Wisdom tooth extraction    . Dilation and curettage of uterus  02/11/12    Hysteroscopy with versapoint resection/myomectomy/  . Incision and drainage of wound Right 01/13/2013    Procedure: IRRIGATION AND DEBRIDEMENT OF NAIL BED ABLATION AND FULL THICKNESS SKIN GRAFT ;  Surgeon: Schuyler Amor, MD;  Location: [facility];  Service: Orthopedics;  Laterality: Right;   Family History  Problem Relation Age of Onset  . Hypertension Mother    History  Substance Use Topics  . Smoking status: Never Smoker   . Smokeless tobacco: Never Used  . Alcohol Use: No   OB History    Gravida Para Term Preterm AB TAB SAB Ectopic Multiple Living   4 2 2  2  2   2      Review of Systems  Cardiovascular: Positive for leg swelling.  All other systems reviewed and are negative.     Allergies  Review of patient's allergies  indicates no known allergies.  Home Medications   Prior to Admission medications   Medication Sig Start Date End Date Taking? Authorizing Provider  naproxen (NAPROSYN) 500 MG tablet Take 1 tablet (500 mg total) by mouth 2 (two) times daily with a meal. 03/25/13   Margarita Mail, PA-C  oxyCODONE-acetaminophen (PERCOCET) 5-325 MG per tablet Take 1-2 tablets by mouth every 4 (four) hours as needed. 03/25/13   Abigail Harris, PA-C   BP 145/91 mmHg  Pulse 77  Temp(Src) 98.4 F (36.9 C) (Oral)  Resp 16  SpO2 98%  LMP 09/20/2014 Physical Exam  Constitutional: She is oriented to person, place, and time. She appears well-developed and well-nourished. No distress.  HENT:  Head: Normocephalic and atraumatic.  Eyes: Conjunctivae and EOM are normal.  Neck: Neck supple. No tracheal deviation present.  Cardiovascular: Normal rate.   Pulmonary/Chest: Effort normal. No respiratory distress.  Musculoskeletal: Normal range of motion.  Neurological: She is alert and oriented to person, place, and time.  Skin: Skin is warm and dry.  Psychiatric: She has a normal mood and affect. Her behavior is normal.  Nursing note and vitals reviewed.   ED Course  Procedures (including critical care time)  DIAGNOSTIC STUDIES: Oxygen Saturation is 98% on RA, normal by my interpretation.    COORDINATION OF CARE:  12:02  PM Will order UTS of complaint.  Patient acknowledges and agrees with plan.    Labs Review Labs Reviewed - No data to display  Imaging Review Dg Ankle Complete Left  09/21/2014   CLINICAL DATA:  Left ankle pain and swelling starting Saturday  EXAM: LEFT ANKLE COMPLETE - 3+ VIEW  COMPARISON:  03/25/2013  FINDINGS: Three views of left ankle submitted. No acute fracture or subluxation. Tiny plantar spur of calcaneus. Ankle mortise is preserved.  IMPRESSION: Negative.   Electronically Signed   By: Lahoma Crocker M.D.   On: 09/21/2014 12:13   Dg Foot Complete Left  09/21/2014   CLINICAL DATA:  Pain and  swelling starting Saturday, no known injury  EXAM: LEFT FOOT - COMPLETE 3+ VIEW  COMPARISON:  None.  FINDINGS: Three views of the left foot submitted. No acute fracture or subluxation. No radiopaque foreign body. Tiny plantar spur of calcaneus.  IMPRESSION: No acute fracture or subluxation.  Tiny plantar spur of calcaneus.   Electronically Signed   By: Lahoma Crocker M.D.   On: 09/21/2014 12:13     EKG Interpretation None    doppler is negative   MDM   Final diagnoses:  Left leg pain    Pt placed in a post op shoe and ace wrap Rx for naprosyn Follow up with Dr. Alvan Dame if pain persist past one week.   Hollace Kinnier [place], PA-C 09/21/14 1453  Carmin Muskrat, MD 09/22/14 [phone]

## 2014-09-21 NOTE — Progress Notes (Signed)
*  PRELIMINARY RESULTS* Vascular Ultrasound Left lower extremity venous duplex has been completed.  Preliminary findings: negative for DVT  Landry Mellow, RDMS, RVT  09/21/2014, 1:54 PM

## 2014-09-21 NOTE — ED Notes (Signed)
Pt reports left knee and foot pain. Pt states that she walks a lot at work and pain is worse then. Pt reports tenderness in heel.

## 2015-06-27 ENCOUNTER — Encounter (HOSPITAL_COMMUNITY): Payer: Self-pay | Admitting: Cardiology

## 2015-06-27 ENCOUNTER — Emergency Department (HOSPITAL_COMMUNITY)
Admission: EM | Admit: 2015-06-27 | Discharge: 2015-06-27 | Disposition: A | Discharge status: Home / Self Care | Payer: No Typology Code available for payment source | Attending: Emergency Medicine | Admitting: Emergency Medicine

## 2015-06-27 ENCOUNTER — Emergency Department (HOSPITAL_COMMUNITY): Payer: No Typology Code available for payment source

## 2015-06-27 DIAGNOSIS — Z86018 Personal history of other benign neoplasm: Secondary | ICD-10-CM | POA: Insufficient documentation

## 2015-06-27 DIAGNOSIS — Z862 Personal history of diseases of the blood and blood-forming organs and certain disorders involving the immune mechanism: Secondary | ICD-10-CM | POA: Insufficient documentation

## 2015-06-27 DIAGNOSIS — Z791 Long term (current) use of non-steroidal anti-inflammatories (NSAID): Secondary | ICD-10-CM | POA: Insufficient documentation

## 2015-06-27 DIAGNOSIS — R05 Cough: Secondary | ICD-10-CM | POA: Insufficient documentation

## 2015-06-27 DIAGNOSIS — M7989 Other specified soft tissue disorders: Secondary | ICD-10-CM | POA: Insufficient documentation

## 2015-06-27 DIAGNOSIS — R509 Fever, unspecified: Secondary | ICD-10-CM

## 2015-06-27 DIAGNOSIS — N390 Urinary tract infection, site not specified: Secondary | ICD-10-CM | POA: Insufficient documentation

## 2015-06-27 LAB — CBC WITH DIFFERENTIAL/PLATELET
Basophils Absolute: 0 10*3/uL (ref 0.0–0.1)
Basophils Relative: 0 %
Eosinophils Absolute: 0.3 10*3/uL (ref 0.0–0.7)
Eosinophils Relative: 4 %
HEMATOCRIT: 39.3 % (ref 36.0–46.0)
Hemoglobin: 12.7 g/dL (ref 12.0–15.0)
LYMPHS ABS: 0.8 10*3/uL (ref 0.7–4.0)
LYMPHS PCT: 14 %
MCH: 30.2 pg (ref 26.0–34.0)
MCHC: 32.3 g/dL (ref 30.0–36.0)
MCV: 93.3 fL (ref 78.0–100.0)
MONOS PCT: 11 %
Monocytes Absolute: 0.6 10*3/uL (ref 0.1–1.0)
NEUTROS ABS: 4.2 10*3/uL (ref 1.7–7.7)
Neutrophils Relative %: 71 %
Platelets: 142 10*3/uL — ABNORMAL LOW (ref 150–400)
RBC: 4.21 MIL/uL (ref 3.87–5.11)
RDW: 11.8 % (ref 11.5–15.5)
WBC: 5.9 10*3/uL (ref 4.0–10.5)

## 2015-06-27 LAB — COMPREHENSIVE METABOLIC PANEL
ALT: 21 U/L (ref 14–54)
ANION GAP: 9 (ref 5–15)
AST: 21 U/L (ref 15–41)
Albumin: 4.1 g/dL (ref 3.5–5.0)
Alkaline Phosphatase: 68 U/L (ref 38–126)
BUN: 6 mg/dL (ref 6–20)
CHLORIDE: 107 mmol/L (ref 101–111)
CO2: 27 mmol/L (ref 22–32)
CREATININE: 0.66 mg/dL (ref 0.44–1.00)
Calcium: 9.3 mg/dL (ref 8.9–10.3)
GFR calc non Af Amer: >60 mL/min (ref >60)
Glucose, Bld: 107 mg/dL — ABNORMAL HIGH (ref 65–99)
POTASSIUM: 3.9 mmol/L (ref 3.5–5.1)
SODIUM: 143 mmol/L (ref 135–145)
Total Bilirubin: 0.4 mg/dL (ref 0.3–1.2)
Total Protein: 7.3 g/dL (ref 6.5–8.1)

## 2015-06-27 LAB — I-STAT CG4 LACTIC ACID, ED: LACTIC ACID, VENOUS: 1.21 mmol/L (ref 0.5–2.0)

## 2015-06-27 MED ORDER — ACETAMINOPHEN 325 MG PO TABS
650.0000 mg | ORAL_TABLET | Freq: Once | ORAL | Status: AC | PRN
  Administered 2015-06-27: 650 mg via ORAL

## 2015-06-27 MED ORDER — ACETAMINOPHEN 325 MG PO TABS
ORAL_TABLET | ORAL | Status: AC
  Filled 2015-06-27: qty 2

## 2015-06-27 MED ORDER — CEPHALEXIN 500 MG PO CAPS: 500.0000 mg | ORAL_CAPSULE | Freq: Four times a day (QID) | ORAL | Status: DC

## 2015-06-27 NOTE — ED Provider Notes (Signed)
CSN: [id number]     Arrival date & time 06/27/15  [id number] History   First MD Initiated Contact with Patient 06/27/15 1540     Chief Complaint  Patient presents with  . Cough  . Fever  . Fatigue  . Emesis     (Consider location/radiation/quality/duration/timing/severity/associated sxs/prior Treatment) HPI   Caitlin Flowers is a 45 y.o. female  who presents for evaluation of fever, achiness, cough started last night. She is using over-the-counter antipyretics, given to her by a nurse at work yesterday. She states that she had a influenza vaccination, in August 2016. No nausea, vomiting, weakness or dizziness.She has had some burning and difficulty when attempting to urinate, for 2 days. There are no other known modifying factors.     Past Medical History  Diagnosis Date  . Fibroid (bleeding) (uterine) 2012  . SVD (spontaneous vaginal delivery)     x 2  . Anemia   . Wears glasses     to read   Past Surgical History  Procedure Laterality Date  . No past surgeries    . Wisdom tooth extraction    . Dilation and curettage of uterus  02/11/12    Hysteroscopy with versapoint resection/myomectomy/  . Incision and drainage of wound Right 01/13/2013    Procedure: IRRIGATION AND DEBRIDEMENT OF NAIL BED ABLATION AND FULL THICKNESS SKIN GRAFT ;  Surgeon: Schuyler Amor, MD;  Location: [facility];  Service: Orthopedics;  Laterality: Right;   Family History  Problem Relation Age of Onset  . Hypertension Mother    Social History  Substance Use Topics  . Smoking status: Never Smoker   . Smokeless tobacco: Never Used  . Alcohol Use: No   OB History    Gravida Para Term Preterm AB TAB SAB Ectopic Multiple Living   4 2 2  2  2   2      Review of Systems  All other systems reviewed and are negative.     Allergies  Review of patient's allergies indicates no known allergies.  Home Medications   Prior to Admission medications   Medication Sig Start Date End Date  Taking? Authorizing Provider  cephALEXin (KEFLEX) 500 MG capsule Take 1 capsule (500 mg total) by mouth 4 (four) times daily. 06/27/15   Daleen Bo, MD  naproxen (NAPROSYN) 500 MG tablet Take 1 tablet (500 mg total) by mouth 2 (two) times daily with a meal. 09/21/14   Fransico Meadow, PA-C  oxyCODONE-acetaminophen (PERCOCET) 5-325 MG per tablet Take 1-2 tablets by mouth every 4 (four) hours as needed. 03/25/13   Abigail Harris, PA-C   BP 103/63 mmHg  Pulse 78  Temp(Src) 99 F (37.2 C) (Oral)  Resp 20  SpO2 100%  LMP 06/12/2015 Physical Exam  Constitutional: She is oriented to person, place, and time. She appears well-developed and well-nourished.  HENT:  Head: Normocephalic and atraumatic.  Right Ear: External ear normal.  Left Ear: External ear normal.  Eyes: Conjunctivae and EOM are normal. Pupils are equal, round, and reactive to light.  Neck: Normal range of motion and phonation normal. Neck supple.  Cardiovascular: Normal rate, regular rhythm and normal heart sounds.   Pulmonary/Chest: Effort normal and breath sounds normal. She exhibits no bony tenderness.  Abdominal: Soft. There is no tenderness.  Musculoskeletal: Normal range of motion.  Neurological: She is alert and oriented to person, place, and time. No cranial nerve deficit or sensory deficit. She exhibits normal muscle tone. Coordination normal.  Skin: Skin  is warm, dry and intact.  Psychiatric: She has a normal mood and affect. Her behavior is normal. Judgment and thought content normal.  Nursing note and vitals reviewed.   ED Course  Procedures (including critical care time)  Medications  acetaminophen (TYLENOL) 325 MG tablet (not administered)  acetaminophen (TYLENOL) tablet 650 mg (650 mg Oral Given 06/27/15 0937)    Patient Vitals for the past 24 hrs:  BP Temp Temp src Pulse Resp SpO2  06/27/15 1356 103/63 mmHg 99 F (37.2 C) Oral 78 20 100 %  06/27/15 0930 165/86 mmHg 100.1 F (37.8 C) Oral 96 24 100 %     4:39 PM Reevaluation with update and discussion. After initial assessment and treatment, an updated evaluation reveals No change in clinical status. Findings discussed with patient and son, all questions were answered. Seith Aikey L     Labs Review Labs Reviewed  COMPREHENSIVE METABOLIC PANEL - Abnormal; Notable for the following:    Glucose, Bld 107 (*)    All other components within normal limits  CBC WITH DIFFERENTIAL/PLATELET - Abnormal; Notable for the following:    Platelets 142 (*)    All other components within normal limits  I-STAT CG4 LACTIC ACID, ED  I-STAT CG4 LACTIC ACID, ED    Imaging Review Dg Chest 2 View  06/27/2015  CLINICAL DATA:  Cough and fever for 1 day. EXAM: CHEST  2 VIEW COMPARISON:  One view chest 11/23/2012. FINDINGS: The heart size and mediastinal contours are stable. There is a stable calcified granuloma in the left lower lobe. The lungs are otherwise clear. There is no pleural effusion or pneumothorax. The bones appear unchanged. IMPRESSION: No acute cardiopulmonary process. Stable calcified left lower lobe granuloma. Electronically Signed   By: Richardean Sale M.D.   On: 06/27/2015 11:03   I have personally reviewed and evaluated these images and lab results as part of my medical decision-making.   EKG Interpretation None      MDM   Final diagnoses:  Febrile illness  Urinary tract infection without hematuria, site unspecified  Leg swelling   Nonspecific febrile illness with elements of both upper respiratory infection, and urinary tract infection. Doubt sepsis, metabolic instability or impending vascular. Patient is low risk for consultation from influenza.  Nursing Notes Reviewed/ Care Coordinated Applicable Imaging Reviewed Interpretation of Laboratory Data incorporated into ED treatment  The patient appears reasonably screened and/or stabilized for discharge and I doubt any other medical condition or other [facility] requiring further  screening, evaluation, or treatment in the ED at this time prior to discharge.  Plan: Home Medications- Keflex, Tylenol; Home Treatments- rest, fluids, compression stocking for leg swelling; return here if the recommended treatment, does not improve the symptoms; Recommended follow up- PCP of choice as needed    Daleen Bo, MD 06/27/15 [id number]

## 2015-06-27 NOTE — Discharge Instructions (Signed)
Get plenty of rest, and drink a lot of fluids. Take Tylenol every 4 hours, or Motrin every 8 hours as needed for fever. For the leg swelling, use a compression stocking, available for any pharmacy.   Fever, Adult A fever is an increase in the body's temperature. It is usually defined as a temperature of 100F (38C) or higher. Brief mild or moderate fevers generally have no long-term effects, and they often do not require treatment. Moderate or high fevers may make you feel uncomfortable and can sometimes be a sign of a serious illness or disease. The sweating that may occur with repeated or prolonged fever may also cause dehydration. Fever is confirmed by taking a temperature with a thermometer. A measured temperature can vary with:  Age.  Time of day.  Location of the thermometer:  Mouth (oral).  Rectum (rectal).  Ear (tympanic).  Underarm (axillary).  Forehead (temporal). HOME CARE INSTRUCTIONS Pay attention to any changes in your symptoms. Take these actions to help with your condition:  Take over-the counter and prescription medicines only as told by your health care provider. Follow the dosing instructions carefully.  If you were prescribed an antibiotic medicine, take it as told by your health care provider. Do not stop taking the antibiotic even if you start to feel better.  Rest as needed.  Drink enough fluid to keep your urine clear or pale yellow. This helps to prevent dehydration.  Sponge yourself or bathe with room-temperature water to help reduce your body temperature as needed. Do not use ice water.  Do not overbundle yourself in blankets or heavy clothes. SEEK MEDICAL CARE IF:  You vomit.  You cannot eat or drink without vomiting.  You have diarrhea.  You have pain when you urinate.  Your symptoms do not improve with treatment.  You develop new symptoms.  You develop excessive weakness. SEEK IMMEDIATE MEDICAL CARE IF:  You have shortness of  breath or have trouble breathing.  You are dizzy or you faint.  You are disoriented or confused.  You develop signs of dehydration, such as a dry mouth, decreased urination, or paleness.  You develop severe pain in your abdomen.  You have persistent vomiting or diarrhea.  You develop a skin rash.  Your symptoms suddenly get worse.   This information is not intended to replace advice given to you by your health care provider. Make sure you discuss any questions you have with your health care provider.   Document Released: 10/02/2000 Document Revised: 12/28/2014 Document Reviewed: 06/02/2014 Elsevier Interactive Patient Education 2016 [employer]  Edema Edema is an abnormal buildup of fluids. It is more common in your legs and thighs. Painless swelling of the feet and ankles is more likely as a person ages. It also is common in looser skin, like around your eyes. HOME CARE   Keep the affected body part above the level of the heart while lying down.  Do not sit still or stand for a long time.  Do not put anything right under your knees when you lie down.  Do not wear tight clothes on your upper legs.  Exercise your legs to help the puffiness (swelling) go down.  Wear elastic bandages or support stockings as told by your doctor.  A low-salt diet may help lessen the puffiness.  Only take medicine as told by your doctor. GET HELP IF:  Treatment is not working.  You have heart, liver, or kidney disease and notice that your skin looks puffy or  shiny.  You have puffiness in your legs that does not get better when you raise your legs.  You have sudden weight gain for no reason. GET HELP RIGHT AWAY IF:   You have shortness of breath or chest pain.  You cannot breathe when you lie down.  You have pain, redness, or warmth in the areas that are puffy.  You have heart, liver, or kidney disease and get edema all of a sudden.  You have a fever and your symptoms get worse  all of a sudden. MAKE SURE YOU:   Understand these instructions.  Will watch your condition.  Will get help right away if you are not doing well or get worse.   This information is not intended to replace advice given to you by your health care provider. Make sure you discuss any questions you have with your health care provider.   Document Released: 09/25/2007 Document Revised: 04/13/2013 Document Reviewed: 01/29/2013 Elsevier Interactive Patient Education 2016 [employer]  Urinary Tract Infection A urinary tract infection (UTI) can occur any place along the urinary tract. The tract includes the kidneys, ureters, bladder, and urethra. A type of germ called bacteria often causes a UTI. UTIs are often helped with antibiotic medicine.  HOME CARE   If given, take antibiotics as told by your doctor. Finish them even if you start to feel better.  Drink enough fluids to keep your pee (urine) clear or pale yellow.  Avoid tea, drinks with caffeine, and bubbly (carbonated) drinks.  Pee often. Avoid holding your pee in for a long time.  Pee before and after having sex (intercourse).  Wipe from front to back after you poop (bowel movement) if you are a woman. Use each tissue only once. GET HELP RIGHT AWAY IF:   You have back pain.  You have lower belly (abdominal) pain.  You have chills.  You feel sick to your stomach (nauseous).  You throw up (vomit).  Your burning or discomfort with peeing does not go away.  You have a fever.  Your symptoms are not better in 3 days. MAKE SURE YOU:   Understand these instructions.  Will watch your condition.  Will get help right away if you are not doing well or get worse.   This information is not intended to replace advice given to you by your health care provider. Make sure you discuss any questions you have with your health care provider.   Document Released: 09/25/2007 Document Revised: 04/29/2014 Document Reviewed:  11/07/2011 Elsevier Interactive Patient Education [employer].

## 2015-06-27 NOTE — ED Notes (Signed)
Pt reports cough, congestion, fever and vomiting since yesterday.

## 2015-12-11 ENCOUNTER — Emergency Department (HOSPITAL_BASED_OUTPATIENT_CLINIC_OR_DEPARTMENT_OTHER)
Admit: 2015-12-11 | Discharge: 2015-12-11 | Disposition: A | Discharge status: Home / Self Care | Payer: BLUE CROSS/BLUE SHIELD | Attending: Emergency Medicine | Admitting: Emergency Medicine

## 2015-12-11 ENCOUNTER — Emergency Department (HOSPITAL_COMMUNITY): Payer: BLUE CROSS/BLUE SHIELD

## 2015-12-11 ENCOUNTER — Emergency Department (HOSPITAL_COMMUNITY)
Admission: EM | Admit: 2015-12-11 | Discharge: 2015-12-11 | Disposition: A | Discharge status: Home / Self Care | Payer: BLUE CROSS/BLUE SHIELD | Attending: Emergency Medicine | Admitting: Emergency Medicine

## 2015-12-11 ENCOUNTER — Encounter (HOSPITAL_COMMUNITY): Payer: Self-pay | Admitting: Emergency Medicine

## 2015-12-11 DIAGNOSIS — M7989 Other specified soft tissue disorders: Secondary | ICD-10-CM | POA: Diagnosis not present

## 2015-12-11 DIAGNOSIS — R609 Edema, unspecified: Secondary | ICD-10-CM

## 2015-12-11 DIAGNOSIS — M79609 Pain in unspecified limb: Secondary | ICD-10-CM | POA: Diagnosis not present

## 2015-12-11 DIAGNOSIS — M79662 Pain in left lower leg: Secondary | ICD-10-CM

## 2015-12-11 DIAGNOSIS — N9489 Other specified conditions associated with female genital organs and menstrual cycle: Secondary | ICD-10-CM | POA: Insufficient documentation

## 2015-12-11 DIAGNOSIS — R109 Unspecified abdominal pain: Secondary | ICD-10-CM | POA: Diagnosis present

## 2015-12-11 DIAGNOSIS — R52 Pain, unspecified: Secondary | ICD-10-CM

## 2015-12-11 DIAGNOSIS — N390 Urinary tract infection, site not specified: Secondary | ICD-10-CM | POA: Insufficient documentation

## 2015-12-11 DIAGNOSIS — N889 Noninflammatory disorder of cervix uteri, unspecified: Secondary | ICD-10-CM

## 2015-12-11 LAB — BASIC METABOLIC PANEL
ANION GAP: 4 — AB (ref 5–15)
BUN: 10 mg/dL (ref 6–20)
CALCIUM: 9.7 mg/dL (ref 8.9–10.3)
CO2: 29 mmol/L (ref 22–32)
Chloride: 109 mmol/L (ref 101–111)
Creatinine, Ser: 0.77 mg/dL (ref 0.44–1.00)
Glucose, Bld: 105 mg/dL — ABNORMAL HIGH (ref 65–99)
Potassium: 3.6 mmol/L (ref 3.5–5.1)
Sodium: 142 mmol/L (ref 135–145)

## 2015-12-11 LAB — CBC WITH DIFFERENTIAL/PLATELET
BASOS ABS: 0 10*3/uL (ref 0.0–0.1)
BASOS PCT: 0 %
Eosinophils Absolute: 0.6 10*3/uL (ref 0.0–0.7)
Eosinophils Relative: 10 %
HEMATOCRIT: 38.4 % (ref 36.0–46.0)
Hemoglobin: 12.7 g/dL (ref 12.0–15.0)
Lymphocytes Relative: 41 %
Lymphs Abs: 2.3 10*3/uL (ref 0.7–4.0)
MCH: 30.5 pg (ref 26.0–34.0)
MCHC: 33.1 g/dL (ref 30.0–36.0)
MCV: 92.3 fL (ref 78.0–100.0)
MONO ABS: 0.3 10*3/uL (ref 0.1–1.0)
Monocytes Relative: 5 %
NEUTROS ABS: 2.5 10*3/uL (ref 1.7–7.7)
NEUTROS PCT: 44 %
PLATELETS: 169 10*3/uL (ref 150–400)
RBC: 4.16 MIL/uL (ref 3.87–5.11)
RDW: 12.5 % (ref 11.5–15.5)
WBC: 5.6 10*3/uL (ref 4.0–10.5)

## 2015-12-11 LAB — URINALYSIS, ROUTINE W REFLEX MICROSCOPIC
GLUCOSE, UA: NEGATIVE mg/dL
KETONES UR: NEGATIVE mg/dL
NITRITE: NEGATIVE
PROTEIN: NEGATIVE mg/dL
Specific Gravity, Urine: 1.029 (ref 1.005–1.030)
pH: 5.5 (ref 5.0–8.0)

## 2015-12-11 LAB — I-STAT BETA HCG BLOOD, ED (MC, WL, AP ONLY): I-stat hCG, quantitative: <5 m[IU]/mL (ref <5)

## 2015-12-11 LAB — URINE MICROSCOPIC-ADD ON: RBC / HPF: NONE SEEN RBC/hpf (ref 0–5)

## 2015-12-11 LAB — HIV ANTIBODY (ROUTINE TESTING W REFLEX): HIV Screen 4th Generation wRfx: NONREACTIVE

## 2015-12-11 LAB — WET PREP, GENITAL
Clue Cells Wet Prep HPF POC: NONE SEEN
Sperm: NONE SEEN
Trich, Wet Prep: NONE SEEN
Yeast Wet Prep HPF POC: NONE SEEN

## 2015-12-11 LAB — RPR: RPR: NONREACTIVE

## 2015-12-11 MED ORDER — NITROFURANTOIN MONOHYD MACRO 100 MG PO CAPS: 100.0000 mg | ORAL_CAPSULE | Freq: Two times a day (BID) | ORAL | 0 refills | Status: DC

## 2015-12-11 MED ORDER — IOPAMIDOL (ISOVUE-300) INJECTION 61%
100.0000 mL | Freq: Once | INTRAVENOUS | Status: AC | PRN
  Administered 2015-12-11: 100 mL via INTRAVENOUS

## 2015-12-11 NOTE — ED Provider Notes (Signed)
[facility] DEPT Provider Note   CSN: [id number] Arrival date & time: 12/11/15  [id number]     History   Chief Complaint Chief Complaint  Patient presents with  . Leg Pain  . Flank Pain    HPI Caitlin Flowers is a 45 y.o. female.  HPI patient and son-in-law are the historians. Patient speaks little [place] and the son-in-law is acting as interpreter.  Pt is a 45 year old female with a history of anemia and chronic lower back pain who presents the emergency department with 1 month of left flank pain. Pain is constant, throbbing, worse with bending, touch, or constipation. Patient denies nausea, vomiting, hematuria, dysuria, vaginal discharge, fever, chills.  Patient also complaining of 3 months of intermittent left lower leg swelling and pain. Patient had similar complaints in June of last year. Doppler of left lower leg was negative in June 2016. Patient states the swelling has been worse over the last week. Patient's complaining of pain just distal to the medial knee only with touch and lateral ankle pain is worse with ambulation. Patient denies numbness/tingling or weakness, recent travel, hormone use, history of DVT.  Past Medical History:  Diagnosis Date  . Anemia   . Fibroid (bleeding) (uterine) 2012  . SVD (spontaneous vaginal delivery)    x 2  . Wears glasses    to read    Patient Active Problem List   Diagnosis Date Noted  . Menometrorrhagia 10/14/2011  . Fibroid uterus 10/14/2011  . Anemia 10/14/2011    Past Surgical History:  Procedure Laterality Date  . DILATION AND CURETTAGE OF UTERUS  02/11/12   Hysteroscopy with versapoint resection/myomectomy/  . INCISION AND DRAINAGE OF WOUND Right 01/13/2013   Procedure: IRRIGATION AND DEBRIDEMENT OF NAIL BED ABLATION AND FULL THICKNESS SKIN GRAFT ;  Surgeon: Schuyler Amor, MD;  Location: [facility];  Service: Orthopedics;  Laterality: Right;  . NO PAST SURGERIES    . WISDOM TOOTH EXTRACTION      OB  History    Gravida Para Term Preterm AB Living   4 2 2   2 2    SAB TAB Ectopic Multiple Live Births   2       2       Home Medications    Prior to Admission medications   Medication Sig Start Date End Date Taking? Authorizing Provider  cephALEXin (KEFLEX) 500 MG capsule Take 1 capsule (500 mg total) by mouth 4 (four) times daily. Patient not taking: Reported on 12/11/2015 06/27/15   Daleen Bo, MD  naproxen (NAPROSYN) 500 MG tablet Take 1 tablet (500 mg total) by mouth 2 (two) times daily with a meal. Patient not taking: Reported on 12/11/2015 09/21/14   Fransico Meadow, PA-C  nitrofurantoin, macrocrystal-monohydrate, (MACROBID) 100 MG capsule Take 1 capsule (100 mg total) by mouth 2 (two) times daily. For 5 days 12/11/15   Kalman Drape, PA  oxyCODONE-acetaminophen (PERCOCET) 5-325 MG per tablet Take 1-2 tablets by mouth every 4 (four) hours as needed. Patient not taking: Reported on 12/11/2015 03/25/13   Margarita Mail, PA-C    Family History Family History  Problem Relation Age of Onset  . Hypertension Mother     Social History Social History  Substance Use Topics  . Smoking status: Never Smoker  . Smokeless tobacco: Never Used  . Alcohol use No     Allergies   Review of patient's allergies indicates no known allergies.   Review of Systems Review of Systems  Constitutional:  Negative for chills and fever.  Respiratory: Negative for chest tightness.   Cardiovascular: Positive for leg swelling. Negative for chest pain.  Gastrointestinal: Positive for abdominal pain. Negative for abdominal distention, blood in stool, constipation, diarrhea, nausea and vomiting.  Genitourinary: Positive for flank pain. Negative for dysuria, hematuria and vaginal discharge.  Musculoskeletal: Negative for neck pain.  Skin: Negative for rash and wound.  Neurological: Negative for dizziness, syncope, weakness and headaches.     Physical Exam Updated Vital Signs BP 138/71 (BP Location:  Left Arm)   Pulse (!) 58   Temp 98.1 F (36.7 C) (Oral)   Resp 17   Ht 5\' 4"  (1.626 m)   Wt 82.2 kg   SpO2 99%   BMI 31.10 kg/m   Physical Exam  Constitutional: She appears well-developed and well-nourished. No distress.  HENT:  Head: Normocephalic and atraumatic.  Eyes: Conjunctivae are normal.  Neck: Normal range of motion.  Cardiovascular: Normal rate, regular rhythm, normal heart sounds and intact distal pulses.  Exam reveals no gallop and no friction rub.   No murmur heard. Pulmonary/Chest: Effort normal and breath sounds normal. No respiratory distress. She has no wheezes. She has no rales. She exhibits no tenderness.  Abdominal: Soft. Normal appearance and bowel sounds are normal. She exhibits no distension and no mass. There is tenderness in the suprapubic area and left lower quadrant. There is no rigidity, no guarding and no CVA tenderness.  Genitourinary:  Genitourinary Comments: Exam performed by Kalman Drape,  exam chaperoned Date: 12/11/2015 Pelvic exam: normal external genitalia without evidence of trauma. VULVA: normal appearing vulva with no masses, tenderness or lesion. VAGINA: normal appearing vagina with normal color and discharge, no lesions. CERVIX: cervix with lesions and friable, scant bleeding when touched with qtip, cervical motion tenderness absent, cervix firm in the area of lesion, cervical os closed with out purulent discharge; vaginal discharge - clear and scant, Wet prep and DNA probe for chlamydia and GC obtained.   ADNEXA: normal adnexa in size, nontender and no masses UTERUS: uterus is slightly enlarged, normal shape, consistency and nontender.    Musculoskeletal: Normal range of motion.  Examination of left lower extremity revealed no deformity, ecchymosis. 1+ pitting edema. TTP just distal to the medial knee, and lateral ankle just distal to the lateral malleoli, full AROM, sensation intact, patient aggressively deck distally  Neurological: She  is alert. Coordination normal.  Skin: Skin is warm and dry. She is not diaphoretic.  Psychiatric: She has a normal mood and affect. Her behavior is normal.  Nursing note and vitals reviewed.    ED Treatments / Results  Labs (all labs ordered are listed, but only abnormal results are displayed) Labs Reviewed  WET PREP, GENITAL - Abnormal; Notable for the following:       Result Value   WBC, Wet Prep HPF POC MANY (*)    All other components within normal limits  URINALYSIS, ROUTINE W REFLEX MICROSCOPIC (NOT AT [facility]) - Abnormal; Notable for the following:    Color, Urine AMBER (*)    APPearance CLOUDY (*)    Hgb urine dipstick MODERATE (*)    Bilirubin Urine SMALL (*)    Leukocytes, UA MODERATE (*)    All other components within normal limits  BASIC METABOLIC PANEL - Abnormal; Notable for the following:    Glucose, Bld 105 (*)    Anion gap 4 (*)    All other components within normal limits  URINE MICROSCOPIC-ADD ON - Abnormal; Notable  for the following:    Squamous Epithelial / LPF 0-5 (*)    Bacteria, UA MANY (*)    Casts HYALINE CASTS (*)    All other components within normal limits  CBC WITH DIFFERENTIAL/PLATELET  RPR  HIV ANTIBODY (ROUTINE TESTING)  I-STAT BETA HCG BLOOD, ED ([facility], [facility], [facility] ONLY)  GC/CHLAMYDIA PROBE AMP (Short) NOT AT [facility]    EKG  EKG Interpretation None       Radiology Dg Ankle Complete Left  Result Date: 12/11/2015 CLINICAL DATA:  Left lower extremity swelling. EXAM: LEFT ANKLE COMPLETE - 3+ VIEW COMPARISON:  None. FINDINGS: There is no evidence of fracture, dislocation, or joint effusion. There is no evidence of arthropathy or other focal bone abnormality. Diffuse soft tissue swelling about the ankle is seen. IMPRESSION: Diffuse soft tissue edema without evidence of acute fracture or dislocation. Electronically Signed   By: Fidela Salisbury M.D.   On: 12/11/2015 08:15   Ct Abdomen Pelvis W Contrast  Result Date: 12/11/2015 CLINICAL DATA:   Left flank pain for 3 months, constipation EXAM: CT ABDOMEN AND PELVIS WITH CONTRAST TECHNIQUE: Multidetector CT imaging of the abdomen and pelvis was performed using the standard protocol following bolus administration of intravenous contrast. CONTRAST:  122mL ISOVUE-300 IOPAMIDOL (ISOVUE-300) INJECTION 61% COMPARISON:  None. FINDINGS: Small hiatal hernia.  Lung bases are unremarkable. Mild fatty infiltration of the liver. No focal hepatic mass. No calcified gallstones are noted within gallbladder. Enhanced pancreas is unremarkable. Enhanced spleen is unremarkable. No adrenal gland mass. Enhanced kidneys are symmetrical in size. No hydronephrosis or hydroureter. Delayed renal images shows bilateral renal symmetrical excretion. Bilateral visualized proximal ureter is unremarkable. No aortic aneurysm.  No retroperitoneal or mesenteric adenopathy. No small bowel obstruction. Study is limited without oral contrast. No thickened or dilated small bowel loops. Normal appendix. No pericecal inflammation. Moderate stool noted in right colon. Some colonic stool noted in transverse colon. Few diverticula are noted descending colon. Scattered diverticula are noted proximal sigmoid colon. No evidence of acute colitis or diverticulitis. Moderate gas and some stool noted in mid sigmoid colon and rectum. No distal colonic obstruction. Mild retroflexed uterus. A posterior body fibroid measures 2.2 cm. Exophytic fundal fibroid measures 2.3 cm. The uterus is normal size. No adnexal masses noted. The urinary bladder is unremarkable.  No inguinal adenopathy. Mild degenerative changes are noted thoracolumbar spine. There is Schmorl's node deformity upper endplate of L5 and L4 vertebral body. IMPRESSION: 1. There is no evidence of acute inflammatory process within abdomen. 2. Mild fatty infiltration of the liver. 3. No hydronephrosis or hydroureter. 4. Normal appendix. No pericecal inflammation. Moderate stool noted in transverse colon.  Moderate gas and stool noted within distal sigmoid colon and rectum. No colonic obstruction. 5. Mild retroflexed uterus. Myometrial fibroids are noted within uterus the largest measures 2.3 cm. No adnexal mass. 6. Degenerative changes thoracolumbar spine. Electronically Signed   By: Lahoma Crocker M.D.   On: 12/11/2015 12:57   Dg Knee Complete 4 Views Left  Result Date: 12/11/2015 CLINICAL DATA:  Pain.  No reported injury. EXAM: LEFT KNEE - COMPLETE 4+ VIEW COMPARISON:  No recent prior. FINDINGS: No acute bony or joint abnormality identified. No evidence of fracture or dislocation. No focal bony abnormality. IMPRESSION: No acute abnormality. Electronically Signed   By: Marcello Moores  Register   On: 12/11/2015 08:16    Procedures Procedures (including critical care time)  Medications Ordered in ED Medications  iopamidol (ISOVUE-300) 61 % injection 100 mL (100 mLs Intravenous  Contrast Given 12/11/15 1145)     Initial Impression / Assessment and Plan / ED Course  I have reviewed the triage vital signs and the nursing notes.  Pertinent labs & imaging results that were available during my care of the patient were reviewed by me and considered in my medical decision making (see chart for details).  Clinical Course   Patient with flank pain, abnormal appearing cervix, and chronic unilateral swelling of left lower leg.  Patient instructed to follow-up with Dr. Roselie Awkward, OB/GYN, this week to have a Pap smear and cervix reevaluated. Patient had a normal Pap smear in 2014 but did not follow-up with Dr. Roselie Awkward. Concerns that the lesion on her cervix could be cancer. Discussed this with the patient and stressed to her that follow-up with Dr. Roselie Awkward was imperative. Due to patient's abdominal pain and abnormal appearing cervix there is concern for potential spread of cervical cancer. CT of abdomen was ordered. CT abdominal pelvis revealed no acute abnormalities.   Pt has been diagnosed with a UTI. Pt is afebrile, no  CVA tenderness, normotensive, and denies N/V less concerning for pyelonephritis. Pt to be dc home with antibiotics and instructions to follow up with PCP if symptoms persist. This is likely the cause of her flank pain along with her constipation. She stated that her flank pain was worse with constipation.  Patient had Doppler of left lower leg which revealed no DVT. X-rays reviewed by me of lower leg revealed no fracture or acute abnormalities. At this time cannot explain patient's chronic unilateral edema.. Instructed patient to use compression stockings and follow up with PCP regarding her swelling.   Discussed strict return precautions with the patient. Discussed strict follow-up with the patient. Patient expressed understanding to the discharge instructions.  Patient case discussed with Dr. Bland Span who agrees with the above plan.  Final Clinical Impressions(s) / ED Diagnoses   Final diagnoses:  Pain  UTI (lower urinary tract infection)  Left flank pain  Pain and swelling of left lower leg  Abnormal appearance of cervix    New Prescriptions Discharge Medication List as of 12/11/2015  2:07 PM    START taking these medications   Details  nitrofurantoin, macrocrystal-monohydrate, (MACROBID) 100 MG capsule Take 1 capsule (100 mg total) by mouth 2 (two) times daily. For 5 days, Starting Mon 12/11/2015, [place], [place] 12/11/15 1658    Duffy Bruce, MD 12/12/15 2050

## 2015-12-11 NOTE — ED Triage Notes (Signed)
Per family pt reports to have left leg pain that has been ongoing for the last 3 months. Swelling in foot extending up to knee.

## 2015-12-11 NOTE — ED Notes (Signed)
Pt is also c/o left flank pain radiating to groin.

## 2015-12-11 NOTE — Discharge Instructions (Signed)
Make an appointment with Dr. Roselie Awkward to be seen this week regarding your abnormal-looking cervix. Take the antibiotic as prescribed and be sure to complete the entire course for your urinary tract infection. Follow-up with the [facility] regarding your left leg swelling.  Return to the emergency department insurance worsening abdominal pain, worsening leg swelling, nausea vomiting, blood in your vomit or stool, vaginal bleeding, fevers or any other concerning symptoms.

## 2015-12-11 NOTE — Progress Notes (Signed)
VASCULAR LAB PRELIMINARY  PRELIMINARY  PRELIMINARY  PRELIMINARY  Left lower extremity venous duplex completed.    Preliminary report:  Left:  No evidence of DVT, superficial thrombosis, or Baker's cyst.  Weyman Bogdon, RVS 12/11/2015, 8:58 AM

## 2015-12-11 NOTE — ED Notes (Signed)
Ultrasound at bedside

## 2015-12-12 LAB — GC/CHLAMYDIA PROBE AMP (~~LOC~~) NOT AT ARMC
Chlamydia: NEGATIVE
Neisseria Gonorrhea: NEGATIVE

## 2016-01-10 ENCOUNTER — Encounter: Payer: Self-pay | Admitting: Obstetrics & Gynecology

## 2016-01-10 ENCOUNTER — Other Ambulatory Visit (HOSPITAL_COMMUNITY)
Admission: RE | Admit: 2016-01-10 | Discharge: 2016-01-10 | Disposition: A | Discharge status: Home / Self Care | Payer: BLUE CROSS/BLUE SHIELD | Source: Ambulatory Visit | Attending: Obstetrics & Gynecology | Admitting: Obstetrics & Gynecology

## 2016-01-10 ENCOUNTER — Ambulatory Visit (INDEPENDENT_AMBULATORY_CARE_PROVIDER_SITE_OTHER): Payer: BLUE CROSS/BLUE SHIELD | Admitting: Obstetrics & Gynecology

## 2016-01-10 VITALS — BP 147/82 | HR 75 | Ht 65.0 in | Wt 186.0 lb

## 2016-01-10 DIAGNOSIS — D069 Carcinoma in situ of cervix, unspecified: Secondary | ICD-10-CM | POA: Insufficient documentation

## 2016-01-10 DIAGNOSIS — R87613 High grade squamous intraepithelial lesion on cytologic smear of cervix (HGSIL): Secondary | ICD-10-CM | POA: Diagnosis present

## 2016-01-10 NOTE — Progress Notes (Signed)
[place] interpreter [phone]

## 2016-01-10 NOTE — Patient Instructions (Signed)
Colposcopy  Colposcopy is a procedure to examine your cervix and vagina, or the area around the outside of your vagina, for abnormalities or signs of disease. The procedure is done using a lighted microscope called a colposcope. Tissue samples may be collected during the colposcopy if your health care provider finds any unusual cells. A colposcopy may be done if a woman has:  · An abnormal Pap test. A Pap test is a medical test done to evaluate cells that are on the surface of the cervix.  · A Pap test result that is suggestive of human papillomavirus (HPV). This virus can cause genital warts and is linked to the development of cervical cancer.  · A sore on her cervix and the results of a Pap test were normal.  · Genital warts on the cervix or in or around the outside of the vagina.  · A mother who took the drug diethylstilbestrol (DES) while pregnant.  · Painful intercourse.  · Vaginal bleeding, especially after sexual intercourse.  LET YOUR HEALTH CARE PROVIDER KNOW ABOUT:  · Any allergies you have.  · All medicines you are taking, including vitamins, herbs, eye drops, creams, and over-the-counter medicines.  · Previous problems you or members of your family have had with the use of anesthetics.  · Any blood disorders you have.  · Previous surgeries you have had.  · Medical conditions you have.  RISKS AND COMPLICATIONS  Generally, a colposcopy is a safe procedure. However, as with any procedure, complications can occur. Possible complications include:  · Bleeding.  · Infection.  · Missed lesions.  BEFORE THE PROCEDURE   · Tell your health care provider if you have your menstrual period. A colposcopy typically is not done during menstruation.  · For 24 hours before the colposcopy, do not:    [place].    Use tampons.    Use medicines, creams, or suppositories in the vagina.    Have sexual intercourse.  PROCEDURE   During the procedure, you will be lying on your back with your feet in foot rests (stirrups). A warm  metal or plastic instrument (speculum) will be placed in your vagina to keep it open and to allow the health care provider to see the cervix. The colposcope will be placed outside the vagina. It will be used to magnify and examine the cervix, vagina, and the area around the outside of the vagina. A small amount of liquid solution will be placed on the area that is to be viewed. This solution will make it easier to see the abnormal cells. Your health care provider will use tools to suck out mucus and cells from the canal of the cervix. Then he or she will record the location of the abnormal areas.  If a biopsy is done during the procedure, a medicine will usually be given to numb the area (local anesthetic). You may feel mild pain or cramping while the biopsy is done. After the procedure, tissue samples collected during the biopsy will be sent to a lab for analysis.  AFTER THE PROCEDURE   You will be given instructions on when to follow up with your health care provider for your test results. It is important to keep your appointment.     This information is not intended to replace advice given to you by your health care provider. Make sure you discuss any questions you have with your health care provider.     Document Released: 06/29/2002 Document Revised: 12/09/2012 Document Reviewed: 11/05/2012    Elsevier Interactive Patient Education ©2016 [employer] Inc[employer]

## 2016-01-10 NOTE — Progress Notes (Signed)
Patient ID: Caitlin Flowers, female   DOB: 1971/03/04, 45 y.o.   MRN: [id number]  Chief Complaint  Patient presents with  . Follow-up  had pap 2014 HSIL did not return for colposcopy  HPI Caitlin Flowers is a 45 y.o. female.  [id number] Patient's last menstrual period was 11/07/2015.  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 Previous colposcopy: did not f/u. Prior cervical treatment: no treatment. She was told by ER Md to f/u here Past Medical History:  Diagnosis Date  . Anemia   . Fibroid (bleeding) (uterine) 2012  . SVD (spontaneous vaginal delivery)    x 2  . Wears glasses    to read    Past Surgical History:  Procedure Laterality Date  . DILATION AND CURETTAGE OF UTERUS  02/11/12   Hysteroscopy with versapoint resection/myomectomy/  . INCISION AND DRAINAGE OF WOUND Right 01/13/2013   Procedure: IRRIGATION AND DEBRIDEMENT OF NAIL BED ABLATION AND FULL THICKNESS SKIN GRAFT ;  Surgeon: Schuyler Amor, MD;  Location: [facility];  Service: Orthopedics;  Laterality: Right;  . NO PAST SURGERIES    . WISDOM TOOTH EXTRACTION      Family History  Problem Relation Age of Onset  . Hypertension Mother     Social History Social History  Substance Use Topics  . Smoking status: Never Smoker  . Smokeless tobacco: Never Used  . Alcohol use No    No Known Allergies  No current outpatient prescriptions on file.   No current facility-administered medications for this visit.     Review of Systems Review of Systems  Musculoskeletal:       Swelling left leg    Blood pressure (!) 147/82, pulse 75, height 5\' 5"  (1.651 m), weight 186 lb (84.4 kg), last menstrual period 11/07/2015.  Physical Exam Physical Exam  Constitutional: She is oriented to person, place, and time. She appears well-developed. No distress.  Pulmonary/Chest: Effort normal.  Genitourinary:  Vagina normal. No vaginal discharge found.  Neurological: She is alert and oriented to person, place, and time.  Skin: Skin is warm and dry.  Psychiatric: She has a normal mood and affect. Her behavior is normal.    Data Reviewed Pap result and ED note  Assessment    Procedure Details  The risks and benefits of the procedure and Written informed consent obtained.  Speculum placed in vagina and excellent visualization of cervix achieved, cervix swabbed x 3 with acetic acid solution. Visible lesion anterior cervix without magnification, ECC and Bx at 12 and 3 Monsel's applied Specimens: as above  Complications: none.     Plan    Specimens labelled and sent to Pathology. Return to discuss Pathology results in 1 weeks.      ARNOLD,JAMES 01/10/2016, 3:58 PM

## 2016-01-16 ENCOUNTER — Encounter: Payer: Self-pay | Admitting: Family Medicine

## 2016-01-23 ENCOUNTER — Ambulatory Visit (INDEPENDENT_AMBULATORY_CARE_PROVIDER_SITE_OTHER): Payer: BLUE CROSS/BLUE SHIELD | Admitting: Obstetrics & Gynecology

## 2016-01-23 ENCOUNTER — Encounter: Payer: Self-pay | Admitting: Family Medicine

## 2016-01-23 DIAGNOSIS — C539 Malignant neoplasm of cervix uteri, unspecified: Secondary | ICD-10-CM | POA: Insufficient documentation

## 2016-01-23 NOTE — Patient Instructions (Signed)
Cervical Cancer The cervix is the opening and bottom part of the uterus between the vagina and the uterus. Cervical cancer is a fairly common cancer. It occurs most often in women between the ages of 40 years and 55 years. Cells of the cervix act very much like skin cells. These cells are exposed to toxins, viruses, and bacteria that may cause abnormal changes.  There are two kinds of cancers of the cervix:   Squamous cell carcinoma. This type of cancer starts in the flat or scale-like cells that line the cervix. Squamous cell carcinoma can develop from a sexually transmitted infection caused by the human papillomavirus (HPV).  Adenocarcinoma. This type of cervical cancer starts in glandular cells that line the cervix. RISK FACTORS The risk of getting cancer of the cervix is related to your lifestyle, sexual history, health, and immune system. Risks for cervical cancer include:   Having a sexually transmitted viral infection. These include:  Chlamydia.   Herpes.   HPV.  Becoming sexually active before age 18 years.  Having more than one sexual partner or having sex with someone who has more than one sexual partner.  Not using condoms with sexual partners.  Having had cancer of the vagina or vulva.  Having a sexual partner who has or had cancer of the penis or who has had a sexual partner with abnormal cervical cells (dysplasia) or cervical cancer.  Using oral contraceptives (also called birth control pills).  Smoking.   Having a weakened immune system. For example, human immunodeficiency virus (HIV) or other immune deficiency disorders.  Being the daughter of a woman who took diethylstilbestrol (DES) during pregnancy.  Having a sister or mother who has had cancer of the cervix.  Being African American, Hispanic, Asian, or a woman from the [place].  A history of dysplasia of the cervix. SIGNS AND SYMPTOMS  Symptoms are usually not present in the early stages of  cervical cancer. Once the cancer invades the cervix and surrounding tissues, the woman may have:   Abnormal vaginal bleeding or menstrual bleeding that is longer or heavier than usual.  Bleeding after intercourse, douching, or a Pap test.  Vaginal bleeding following menopause.  Abnormal vaginal discharge.  Pelvic discomfort or pain.  An abnormal Pap test.  Pain during sexual intercourse. Symptoms of more advanced cervical cancer may include:   Loss of appetite or weight loss.  Tiredness (fatigue).  Back and leg pain.  Inability to control urination or bowel movements. DIAGNOSIS  A pelvic exam and Pap test are done to diagnose the condition. If abnormalities are found during the exam or Pap test, the Pap test may be repeated in 3 months, or your health care provider may do additional tests or procedures, such as:   A colposcopy. This is a procedure that uses a special microscope that allows the health care provider to magnify and closely examine the cells of the cervix, vagina, and vulva.  Cervical biopsies. This is a procedure where small tissue samples are taken from the cervix to be examined under a microscope by a specialist.   A cone biopsy. This is a procedure to test for or remove cancerous tissue. Other tests may be needed, including:   Cystoscopy.   Proctoscopy or sigmoidoscopy.  Ultrasound.   CT scan.   MRI.   Laparoscopy.  There are different stages of cervical cancer:   Stage 0, carcinoma in situ (CIS)--This first stage of cancer is the last and most serious stage of   dysplasia.  Stage I--This means the tumor is in the uterus and cervix only.  Stage II--This means the tumor has spread to the upper vagina. The cancer has spread beyond the uterus but not to the pelvic walls or lower third of the vagina.  Stage III--This means the tumor has invaded the side wall of the pelvis and the lower third of the vagina. If the tumor blocks the tubes that  carry urine to the bladder (ureters), it may cause urine to back up and the kidneys to swell (hydronephrosis).  Stage IV--This means the tumor has spread to the rectum or bladder. In the later part of this stage, it has also spread to distant organs, like the lungs. TREATMENT  Treatment options can include:   Cone biopsy to remove the cancerous tissue.   Removal of the entire uterus and cervix.   Removal of the uterus, cervix, upper vagina, lymph nodes, and surrounding tissue (modified radical hysterectomy). The ovaries may be left in place or removed.  Medicines to treat cancer.   A combination of surgery, radiation, and chemotherapy.   Biological response modifiers. These are substances that help strengthen your immune system's fight against cancer or infection. They may be used in combination with chemotherapy. HOME CARE INSTRUCTIONS   Get a gynecology exam and Pap test once every year or as directed by your health care provider.   Get the HPV vaccine.   Do not smoke.  Do not have sexual intercourse until your health care provider says it is okay.  Use a condom every time you have sex. SEEK MEDICAL CARE IF:   You have increased pelvic pain or pressure.   Your are becoming increasingly tired.   You have increased leg or back pain.   You have a fever.  You have abnormal bleeding or discharge.  You lose weight. SEEK IMMEDIATE MEDICAL CARE IF:   You cannot urinate.  You have blood in your urine.   You have blood or pressure with a bowel movement.   You develop severe back, stomach, or pelvic pain.   This information is not intended to replace advice given to you by your health care provider. Make sure you discuss any questions you have with your health care provider.   Document Released: 04/08/2005 Document Revised: 04/13/2013 Document Reviewed: 09/30/2012 Elsevier Interactive Patient Education 2016 [employer]  

## 2016-01-23 NOTE — Progress Notes (Signed)
Patient ID: Caitlin Flowers, female   DOB: 11-16-1970, 45 y.o.   MRN: [id number]   Patient returns for result of her cervical biopsy. No pain or bleeding. Amharic interpreter used.  1. 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 - SEE DESCRIPTION. 2. Endocervical biopsy, with brush - DETACHED SQUAMOUS FRAGMENTS WITH HIGH GRADE SQUAMOUS INTRAEPITHELIAL LESION, CIN-III. - SCANT BENIGN ENDOCERVICAL CELLS. Microscopic Comment 1. Called to Dr. Jordan Hawks office on 01/12/16. (JDP:ds 01/12/16)  Impression: Biopsy positive invasive cervical cancer  Plan: referral to Dr Denman George for evaluation and possible hysterectomy. I explained the result of the biopsy and possible further testing she may need to determine what management can be done. 15 min face to face and coordination of care  Woodroe Mode, MD 01/23/2016

## 2016-01-29 NOTE — Progress Notes (Signed)
Consult Note: Gyn-Onc  Consult was requested by Dr. Roselie Awkward for the evaluation of Caitlin Flowers 45 y.o. female  CC:  Chief Complaint  Patient presents with  . Cervical Cancer    New Consultation    Assessment/Plan:  [name]  is a 45 y.o.  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 However, given her left LE edema, I have considerable concern that she might have left sidewall disease (eg lymphadenopathy). She does not appear to have disease by direct extension on exam. Her CT scan in August, 2017 failed to demonstrate this. Therefore I have ordered a PET/CT to better evaluate for occult distant metastases.  If the PET is negative for extracervical metastases, we will proceed with robotic assisted radical hysterectomy, bilateral salpingectomy, and SLN biopsy.  I discussed with the patient that this procedure is associated with considerable risks including blood loss, infection, damage to internal organs, nerve injury (particularly to bladder, which can result in permanent urinary retention requiring catheterization), sexual dysfunction, bowel dysfunction, reoperation, lymphedema, VTE.  I discussed that postoperatively she will require bladder rest for a 1 week period.  If distant metastases are identified on imaging, we will proceed with primary chemoradiation vs chemotherapy.  HPI: Caitlin Flowers is a 45 year old [place] woman (speaks Amharic) who is seen in consultation at the request of Dr Roselie Awkward for high grade SCC of the cervix.  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 She was recommended to return for colposcopy but failed to do so.  In August, 2017 she began noticing edema of her left lower extremity. She was evaluated in the ED with a doppler of the lower extremity which was negative for DVT and a CT abdo/pelvis which showed no hydronephrosis or hydroureter, small uterine  fibroids, but no lesions to explain her LE edema.  She followed up with Dr Roselie Awkward on 01/10/16 and proceed with colposcopy.   At the time of colposcopy a visible lesion was seen on the anterior lip of the cervix. It was biopsied and returned as high grade squamous cell carcinoma. Endocervical curette revealed CIN III.  The patient is otherwise healthy. SHe has had no prior abdominal surgeries, though has had a hysteroscopic resection of a myoma in the past. She denies HIV exposure and is a non-smoker.  She denies intermenstrual bleeding and abnormal discharge.  Current Meds:  No outpatient encounter prescriptions on file as of 01/30/2016.   No facility-administered encounter medications on file as of 01/30/2016.     Allergy: No Known Allergies  Social Hx:   Social History   Social History  . Marital status: Legally Separated    Spouse name: N/A  . Number of children: N/A  . Years of education: N/A   Occupational History  . Not on file.   Social History Main Topics  . Smoking status: Never Smoker  . Smokeless tobacco: Never Used  . Alcohol use No  . Drug use: No  . Sexual activity: Yes    Birth control/ protection: None   Other Topics Concern  . Not on file   Social History Narrative  . No narrative on file    Past Surgical Hx:  Past Surgical History:  Procedure Laterality Date  . DILATION AND CURETTAGE OF UTERUS  02/11/12   Hysteroscopy with versapoint resection/myomectomy/  . INCISION AND DRAINAGE OF WOUND Right 01/13/2013   Procedure: IRRIGATION AND DEBRIDEMENT OF NAIL  BED ABLATION AND FULL THICKNESS SKIN GRAFT ;  Surgeon: Schuyler Amor, MD;  Location: [facility];  Service: Orthopedics;  Laterality: Right;  . NO PAST SURGERIES    . WISDOM TOOTH EXTRACTION      Past Medical Hx:  Past Medical History:  Diagnosis Date  . Anemia   . Fibroid (bleeding) (uterine) 2012  . SVD (spontaneous vaginal delivery)    x 2  . Wears glasses    to read     Past Gynecological History:  SVD x 2 No LMP recorded.  Family Hx:  Family History  Problem Relation Age of Onset  . Hypertension Mother     Review of Systems:  Constitutional  Feels well,    ENT Normal appearing ears and nares bilaterally Skin/Breast  No rash, sores, jaundice, itching, dryness Cardiovascular  No chest pain, shortness of breath, or edema  Pulmonary  No cough or wheeze.  Gastro Intestinal  No nausea, vomitting, or diarrhoea. No bright red blood per rectum, no abdominal pain, change in bowel movement, or constipation.  Genito Urinary  No frequency, urgency, dysuria, no bleeding Musculo Skeletal  + LLE edema 3+ (worse with activity, improved with elevation) Neurologic  No weakness, numbness, change in gait,  Psychology  No depression, anxiety, insomnia.   Vitals:  Blood pressure (!) 125/99, pulse 69, temperature 98.4 F (36.9 C), temperature source Oral, resp. rate 18, height 5\' 5"  (1.651 m), weight 186 lb 11.2 oz (84.7 kg), SpO2 100 %.  Physical Exam: WD in NAD Neck  Supple NROM, without any enlargements.  Lymph Node Survey No cervical supraclavicular or inguinal adenopathy however there is fullness in the left inguinal region. Cardiovascular  Pulse normal rate, regularity and rhythm. S1 and S2 normal.  Lungs  Clear to auscultation bilateraly, without wheezes/crackles/rhonchi. Good air movement.  Skin  No rash/lesions/breakdown  Psychiatry  Alert and oriented to person, place, and time  Abdomen  Normoactive bowel sounds, abdomen soft, non-tender and nonobese without evidence of hernia.  Back No CVA tenderness Genito Urinary  Vulva/vagina: Normal external female genitalia.  No lesions. No discharge or bleeding.  Bladder/urethra:  No lesions or masses, well supported bladder  Vagina: normal  Cervix: 3cm visible friable slightly exophytic lesion consistent with cervical cancer on the ectocervix between 10 o'clock and 1 o'clock anteriorally. No  palpable disease in the paracolpos or parametrium.  Uterus: Small, mobile, no parametrial involvement or nodularity.  Adnexa: no palpable masses. Rectal  Good tone, no masses no cul de sac nodularity. No parametrial infiltration appreciated. Extremities  No bilateral cyanosis, clubbing or edema.   [name], MD  01/30/2016, 5:30 PM

## 2016-01-30 ENCOUNTER — Encounter: Payer: Self-pay | Admitting: Gynecologic Oncology

## 2016-01-30 ENCOUNTER — Ambulatory Visit: Payer: BLUE CROSS/BLUE SHIELD | Attending: Gynecologic Oncology | Admitting: Gynecologic Oncology

## 2016-01-30 VITALS — BP 125/99 | HR 69 | Temp 98.4°F | Resp 18 | Ht 65.0 in | Wt 186.7 lb

## 2016-01-30 DIAGNOSIS — Z9889 Other specified postprocedural states: Secondary | ICD-10-CM | POA: Diagnosis not present

## 2016-01-30 DIAGNOSIS — R600 Localized edema: Secondary | ICD-10-CM | POA: Diagnosis not present

## 2016-01-30 DIAGNOSIS — C539 Malignant neoplasm of cervix uteri, unspecified: Secondary | ICD-10-CM

## 2016-01-30 NOTE — Patient Instructions (Signed)
Plan to have a PET scan on February 07, 2016 at [facility].  Nothing to eat or drink 6 hours before.  We will contact you with the results.  If there is evidence of spread, we will cancel the surgery and plan for radiation.                Preparing for your Surgery  Plan for surgery on February 20, 2016 with Dr. Everitt Amber.  You will be scheduled for a robotic assisted radical hysterectomy, bilateral salpingectomy, sentinel lymph node biopsy.  Pre-operative Testing -You will receive a phone call from presurgical testing at [facility] to arrange for a pre-operative testing appointment before your surgery.  This appointment normally occurs one to two weeks before your scheduled surgery.   -Bring your insurance card, copy of an advanced directive if applicable, medication list  -At that visit, you will be asked to sign a consent for a possible blood transfusion in case a transfusion becomes necessary during surgery.  The need for a blood transfusion is rare but having consent is a necessary part of your care.     -You should not be taking blood thinners or aspirin at least ten days prior to surgery unless instructed by your surgeon.  Day Before Surgery at [facility] will be asked to take in a light diet the day before surgery.  Avoid carbonated beverages.  You will be advised to have nothing to eat or drink after midnight the evening before.     Eat a light diet the day before surgery.  Examples including soups, broths,  toast, yogurt, mashed potatoes.  Things to avoid include carbonated beverages  (fizzy beverages), raw fruits and raw vegetables, or beans.    If your bowels are filled with gas, your surgeon will have difficulty  visualizing your pelvic organs which increases your surgical risks.  Your role in recovery Your role is to become active as soon as directed by your doctor, while still giving yourself time to heal.  Rest when you feel tired. You will be asked to do the  following in order to speed your recovery:  - Cough and breathe deeply. This helps toclear and expand your lungs and can prevent pneumonia. You may be given a spirometer to practice deep breathing. A staff member will show you how to use the spirometer. - Do mild physical activity. Walking or moving your legs help your circulation and body functions return to normal. A staff member will help you when you try to walk and will provide you with simple exercises. Do not try to get up or walk alone the first time. - Actively manage your pain. Managing your pain lets you move in comfort. We will ask you to rate your pain on a scale of zero to 10. It is your responsibility to tell your doctor or nurse where and how much you hurt so your pain can be treated.  Special Considerations -If you are diabetic, you may be placed on insulin after surgery to have closer control over your blood sugars to promote healing and recovery.  This does not mean that you will be discharged on insulin.  If applicable, your oral antidiabetics will be resumed when you are tolerating a solid diet.  -Your final pathology results from surgery should be available by the Friday after surgery and the results will be relayed to you when available.   Blood Transfusion Information WHAT IS A BLOOD TRANSFUSION? A transfusion is  the replacement of blood or some of its parts. Blood is made up of multiple cells which provide different functions.  Red blood cells carry oxygen and are used for blood loss replacement.  White blood cells fight against infection.  Platelets control bleeding.  Plasma helps clot blood.  Other blood products are available for specialized needs, such as hemophilia or other clotting disorders. BEFORE THE TRANSFUSION  Who gives blood for transfusions?   You may be able to donate blood to be used at a later date on yourself (autologous donation).  Relatives can be asked to donate blood. This is generally not  any safer than if you have received blood from a stranger. The same precautions are taken to ensure safety when a relative's blood is donated.  Healthy volunteers who are fully evaluated to make sure their blood is safe. This is blood bank blood. Transfusion therapy is the safest it has ever been in the practice of medicine. Before blood is taken from a donor, a complete history is taken to make sure that person has no history of diseases nor engages in risky social behavior (examples are intravenous drug use or sexual activity with multiple partners). The donor's travel history is screened to minimize risk of transmitting infections, such as malaria. The donated blood is tested for signs of infectious diseases, such as HIV and hepatitis. The blood is then tested to be sure it is compatible with you in order to minimize the chance of a transfusion reaction. If you or a relative donates blood, this is often done in anticipation of surgery and is not appropriate for emergency situations. It takes many days to process the donated blood. RISKS AND COMPLICATIONS Although transfusion therapy is very safe and saves many lives, the main dangers of transfusion include:   Getting an infectious disease.  Developing a transfusion reaction. This is an allergic reaction to something in the blood you were given. Every precaution is taken to prevent this. The decision to have a blood transfusion has been considered carefully by your caregiver before blood is given. Blood is not given unless the benefits outweigh the risks.

## 2016-02-07 ENCOUNTER — Encounter (HOSPITAL_COMMUNITY)
Admission: RE | Admit: 2016-02-07 | Discharge: 2016-02-07 | Disposition: A | Discharge status: Home / Self Care | Payer: BLUE CROSS/BLUE SHIELD | Source: Ambulatory Visit | Attending: Gynecologic Oncology | Admitting: Gynecologic Oncology

## 2016-02-07 DIAGNOSIS — C539 Malignant neoplasm of cervix uteri, unspecified: Secondary | ICD-10-CM | POA: Insufficient documentation

## 2016-02-07 DIAGNOSIS — R600 Localized edema: Secondary | ICD-10-CM | POA: Diagnosis present

## 2016-02-07 LAB — GLUCOSE, CAPILLARY: GLUCOSE-CAPILLARY: 94 mg/dL (ref 65–99)

## 2016-02-07 MED ORDER — FLUDEOXYGLUCOSE F - 18 (FDG) INJECTION
9.2800 | Freq: Once | INTRAVENOUS | Status: AC | PRN
  Administered 2016-02-07: 9.28 via INTRAVENOUS

## 2016-02-09 ENCOUNTER — Telehealth: Payer: Self-pay

## 2016-02-09 NOTE — Telephone Encounter (Signed)
Orders received from [facility], APNP to conatct the patient to update with PET scan results obtained on October 18 , 2017. "no evidence of spread". The patient is to proceed with scheduled surgery as planned . "will evaluate for microscopic spread after surgery". Attempted to contact the patient , no answer, left a detailed message with call back requested. Updated , Melissa Cross, APNP of not contacting the patient. Will attempt again next week , surgery is scheduled for February 20, 2016 with Dr Everitt Amber.

## 2016-02-16 ENCOUNTER — Encounter: Payer: Self-pay | Admitting: Gynecologic Oncology

## 2016-02-16 ENCOUNTER — Encounter (HOSPITAL_COMMUNITY)
Admission: RE | Admit: 2016-02-16 | Discharge: 2016-02-16 | Disposition: A | Discharge status: Home / Self Care | Payer: BLUE CROSS/BLUE SHIELD | Source: Ambulatory Visit | Attending: Gynecologic Oncology | Admitting: Gynecologic Oncology

## 2016-02-16 NOTE — Progress Notes (Signed)
Patient showed up in the lobby with an interpreter and family member stating she wants to cancel her surgery and get a second opinion from her "family doctor."  Also requesting medical records.  Advised that if she decides not to proceed with surgery on the 31st of October, that she will not be able to have that spot if she changes her mind over the weekend and also advised that we are booked out until end of November.  The patient's family member kept saying the patient does not feel comfortable with surgery.  Advised that her health is our number one concern.  Form from medical records completed by the patient.  Patient is advised to call our office for any questions, concerns, or to reschedule her surgery etc if she decides to go that route.

## 2016-02-16 NOTE — Pre-Procedure Instructions (Signed)
CXR 06-27-15 epic

## 2016-02-16 NOTE — Patient Instructions (Signed)
Caitlin Flowers  02/16/2016   Your procedure is scheduled on: 02-20-16  Report to [facility] Main  Entrance take [facility]  elevators to 3rd floor to  [place] at 7:45 AM.  Call this number if you have problems the morning of surgery [phone]   Remember: ONLY 1 PERSON MAY GO WITH YOU TO SHORT STAY TO GET  READY MORNING OF [place].  Do not eat food or drink liquids :After Midnight.     Take these medicines the morning of surgery with A SIP OF WATER:  DO NOT TAKE ANY DIABETIC MEDICATIONS DAY OF YOUR SURGERY                               You may not have any metal on your body including hair pins and              piercings  Do not wear jewelry, make-up, lotions, powders or perfumes, deodorant             Do not wear nail polish.  Do not shave  48 hours prior to surgery.              Men may shave face and neck.   Do not bring valuables to the hospital. [place].  Contacts, dentures or bridgework may not be worn into surgery.  Leave suitcase in the car. After surgery it may be brought to your room.     Patients discharged the day of surgery will not be allowed to drive home.  Name and phone number of your driver:  Special Instructions: N/A              Please read over the following fact sheets you were given: _____________________________________________________________________             [facility] - Preparing for Surgery Before surgery, you can play an important role.  Because skin is not sterile, your skin needs to be as free of germs as possible.  You can reduce the number of germs on your skin by washing with CHG (chlorahexidine gluconate) soap before surgery.  CHG is an antiseptic cleaner which kills germs and bonds with the skin to continue killing germs even after washing. Please DO NOT use if you have an allergy to CHG or antibacterial soaps.  If your skin becomes reddened/irritated  stop using the CHG and inform your nurse when you arrive at Short Stay. Do not shave (including legs and underarms) for at least 48 hours prior to the first CHG shower.  You may shave your face/neck. Please follow these instructions carefully:  1.  Shower with CHG Soap the night before surgery and the  morning of Surgery.  2.  If you choose to wash your hair, wash your hair first as usual with your  normal  shampoo.  3.  After you shampoo, rinse your hair and body thoroughly to remove the  shampoo.                           4.  Use CHG as you would any other liquid soap.  You can apply chg directly  to the skin and wash  Gently with a scrungie or clean washcloth.  5.  Apply the CHG Soap to your body ONLY FROM THE NECK DOWN.   Do not use on face/ open                           Wound or open sores. Avoid contact with eyes, ears mouth and genitals (private parts).                       Wash face,  Genitals (private parts) with your normal soap.             6.  Wash thoroughly, paying special attention to the area where your surgery  will be performed.  7.  Thoroughly rinse your body with warm water from the neck down.  8.  DO NOT shower/wash with your normal soap after using and rinsing off  the CHG Soap.                9.  Pat yourself dry with a clean towel.            10.  Wear clean pajamas.            11.  Place clean sheets on your bed the night of your first shower and do not  sleep with pets. Day of Surgery : Do not apply any lotions/deodorants the morning of surgery.  Please wear clean clothes to the hospital/surgery center.  FAILURE TO FOLLOW THESE INSTRUCTIONS MAY RESULT IN THE CANCELLATION OF YOUR SURGERY PATIENT SIGNATURE_________________________________  NURSE SIGNATURE__________________________________  ________________________________________________________________________   Adam Phenix  An incentive spirometer is a tool that can help keep your  lungs clear and active. This tool measures how well you are filling your lungs with each breath. Taking long deep breaths may help reverse or decrease the chance of developing breathing (pulmonary) problems (especially infection) following:  A long period of time when you are unable to move or be active. BEFORE THE PROCEDURE   If the spirometer includes an indicator to show your best effort, your nurse or respiratory therapist will set it to a desired goal.  If possible, sit up straight or lean slightly forward. Try not to slouch.  Hold the incentive spirometer in an upright position. INSTRUCTIONS FOR USE  1. Sit on the edge of your bed if possible, or sit up as far as you can in bed or on a chair. 2. Hold the incentive spirometer in an upright position. 3. Breathe out normally. 4. Place the mouthpiece in your mouth and seal your lips tightly around it. 5. Breathe in slowly and as deeply as possible, raising the piston or the ball toward the top of the column. 6. Hold your breath for 3-5 seconds or for as long as possible. Allow the piston or ball to fall to the bottom of the column. 7. Remove the mouthpiece from your mouth and breathe out normally. 8. Rest for a few seconds and repeat Steps 1 through 7 at least 10 times every 1-2 hours when you are awake. Take your time and take a few normal breaths between deep breaths. 9. The spirometer may include an indicator to show your best effort. Use the indicator as a goal to work toward during each repetition. 10. After each set of 10 deep breaths, practice coughing to be sure your lungs are clear. If you have an incision (the cut made at the time of surgery),  support your incision when coughing by placing a pillow or rolled up towels firmly against it. Once you are able to get out of bed, walk around indoors and cough well. You may stop using the incentive spirometer when instructed by your caregiver.  RISKS AND COMPLICATIONS  Take your time so  you do not get dizzy or light-headed.  If you are in pain, you may need to take or ask for pain medication before doing incentive spirometry. It is harder to take a deep breath if you are having pain. AFTER USE  Rest and breathe slowly and easily.  It can be helpful to keep track of a log of your progress. Your caregiver can provide you with a simple table to help with this. If you are using the spirometer at home, follow these instructions: [place] IF:   You are having difficultly using the spirometer.  You have trouble using the spirometer as often as instructed.  Your pain medication is not giving enough relief while using the spirometer.  You develop fever of 100.5 F (38.1 C) or higher. SEEK IMMEDIATE MEDICAL CARE IF:   You cough up bloody sputum that had not been present before.  You develop fever of 102 F (38.9 C) or greater.  You develop worsening pain at or near the incision site. MAKE SURE YOU:   Understand these instructions.  Will watch your condition.  Will get help right away if you are not doing well or get worse. Document Released: 08/19/2006 Document Revised: 07/01/2011 Document Reviewed: 10/20/2006 ExitCare Patient Information 2014 ExitCare, [place].   ________________________________________________________________________  WHAT IS A BLOOD TRANSFUSION? Blood Transfusion Information  A transfusion is the replacement of blood or some of its parts. Blood is made up of multiple cells which provide different functions.  Red blood cells carry oxygen and are used for blood loss replacement.  White blood cells fight against infection.  Platelets control bleeding.  Plasma helps clot blood.  Other blood products are available for specialized needs, such as hemophilia or other clotting disorders. BEFORE THE TRANSFUSION  Who gives blood for transfusions?   Healthy volunteers who are fully evaluated to make sure their blood is safe. This is blood  bank blood. Transfusion therapy is the safest it has ever been in the practice of medicine. Before blood is taken from a donor, a complete history is taken to make sure that person has no history of diseases nor engages in risky social behavior (examples are intravenous drug use or sexual activity with multiple partners). The donor's travel history is screened to minimize risk of transmitting infections, such as malaria. The donated blood is tested for signs of infectious diseases, such as HIV and hepatitis. The blood is then tested to be sure it is compatible with you in order to minimize the chance of a transfusion reaction. If you or a relative donates blood, this is often done in anticipation of surgery and is not appropriate for emergency situations. It takes many days to process the donated blood. RISKS AND COMPLICATIONS Although transfusion therapy is very safe and saves many lives, the main dangers of transfusion include:   Getting an infectious disease.  Developing a transfusion reaction. This is an allergic reaction to something in the blood you were given. Every precaution is taken to prevent this. The decision to have a blood transfusion has been considered carefully by your caregiver before blood is given. Blood is not given unless the benefits outweigh the risks. AFTER THE TRANSFUSION  Right after receiving a blood transfusion, you will usually feel much better and more energetic. This is especially true if your red blood cells have gotten low (anemic). The transfusion raises the level of the red blood cells which carry oxygen, and this usually causes an energy increase.  The nurse administering the transfusion will monitor you carefully for complications. HOME CARE INSTRUCTIONS  No special instructions are needed after a transfusion. You may find your energy is better. Speak with your caregiver about any limitations on activity for underlying diseases you may have. SEEK MEDICAL CARE  IF:   Your condition is not improving after your transfusion.  You develop redness or irritation at the intravenous (IV) site. SEEK IMMEDIATE MEDICAL CARE IF:  Any of the following symptoms occur over the next 12 hours:  Shaking chills.  You have a temperature by mouth above 102 F (38.9 C), not controlled by medicine.  Chest, back, or muscle pain.  People around you feel you are not acting correctly or are confused.  Shortness of breath or difficulty breathing.  Dizziness and fainting.  You get a rash or develop hives.  You have a decrease in urine output.  Your urine turns a dark color or changes to pink, red, or brown. Any of the following symptoms occur over the next 10 days:  You have a temperature by mouth above 102 F (38.9 C), not controlled by medicine.  Shortness of breath.  Weakness after normal activity.  The white part of the eye turns yellow (jaundice).  You have a decrease in the amount of urine or are urinating less often.  Your urine turns a dark color or changes to pink, red, or brown. Document Released: 04/05/2000 Document Revised: 07/01/2011 Document Reviewed: 11/23/2007 [facility] Patient Information 2014 [place], [place].  _______________________________________________________________________

## 2016-02-20 ENCOUNTER — Encounter (HOSPITAL_COMMUNITY): Admission: RE | Payer: Self-pay | Source: Ambulatory Visit

## 2016-02-20 ENCOUNTER — Inpatient Hospital Stay (HOSPITAL_COMMUNITY)
Admission: RE | Admit: 2016-02-20 | Payer: BLUE CROSS/BLUE SHIELD | Source: Ambulatory Visit | Admitting: Gynecologic Oncology

## 2016-02-20 ENCOUNTER — Telehealth: Payer: Self-pay | Admitting: *Deleted

## 2016-02-20 SURGERY — HYSTERECTOMY, VAGINAL, ROBOT-ASSISTED
Anesthesia: General

## 2016-02-20 NOTE — Telephone Encounter (Signed)
Notified Patient's son in law Amanuel Gebregiorgis at [phone] of  Patient's scheduled appointments. Pt has an appointments on 03/06/16 with Dr. Denman George and pre-surgical testing.

## 2016-02-27 NOTE — Progress Notes (Signed)
Pt is being scheduled for preop appt; please place surgical orders in epic. Thanks.  

## 2016-03-04 ENCOUNTER — Other Ambulatory Visit: Payer: Self-pay | Admitting: Infectious Disease

## 2016-03-04 ENCOUNTER — Encounter: Payer: Self-pay | Admitting: Gynecologic Oncology

## 2016-03-04 ENCOUNTER — Ambulatory Visit
Admission: RE | Admit: 2016-03-04 | Discharge: 2016-03-04 | Disposition: A | Discharge status: Home / Self Care | Payer: No Typology Code available for payment source | Source: Ambulatory Visit | Attending: Infectious Disease | Admitting: Infectious Disease

## 2016-03-04 DIAGNOSIS — R059 Cough, unspecified: Secondary | ICD-10-CM

## 2016-03-04 DIAGNOSIS — R05 Cough: Secondary | ICD-10-CM

## 2016-03-04 DIAGNOSIS — Z201 Contact with and (suspected) exposure to tuberculosis: Secondary | ICD-10-CM

## 2016-03-04 NOTE — Progress Notes (Signed)
Note from 02/19/16:    Patient presented to the office with her son in law asking about her surgery for tomorrow.  She states she received a phone call stating her surgery is still tomorrow.  Patient informed by Franki Cabot, NT that she was told on Friday with her other family member that her surgery would be cancelled because she did not want to proceed and she wanted a second opinion.  Her son-in-law is confused about why she wanted to cancel.  They discussed everything with her family over the weekend. Advised that she would need to see Dr. Denman George again for pre-op and she would be rescheduled for her next robotic OR day which is Nov 28. Follow up appt made.  Advised to call for any needs by KJ.

## 2016-03-05 NOTE — Patient Instructions (Signed)
Caitlin Flowers  03/05/2016   Your procedure is scheduled on: Tuesday 03/19/2016  Report to [facility] Main  Entrance take Caitlin Flowers  elevators to 3rd floor to  Caitlin Flowers at  [place]  AM.  Call this number if you have problems the morning of surgery [phone]   Remember: ONLY 1 PERSON MAY GO WITH YOU TO SHORT STAY TO GET  READY MORNING OF Caitlin Flowers.            Eat a light diet the day before surgery.  Examples including soups, broths, toast, yogurt, mashed potatoes.  Things to avoid include carbonated beverages (fizzy beverages), raw fruits and raw vegetables, or beans.   If your bowels are filled with gas, your surgeon will have difficulty visualizing your pelvic organs which increases your surgical risks.     Do not eat food or drink liquids :After Midnight.     Take these medicines the morning of surgery with A SIP OF WATER: none                                 You may not have any metal on your body including hair pins and              piercings  Do not wear jewelry, make-up, lotions, powders or perfumes, deodorant             Do not wear nail polish.  Do not shave  48 hours prior to surgery.              Men may shave face and neck.   Do not bring valuables to the hospital. Caitlin Flowers.  Contacts, dentures or bridgework may not be worn into surgery.  Leave suitcase in the car. After surgery it may be brought to your room.                  Please read over the following fact sheets you were given: _____________________________________________________________________             [facility] - Preparing for Surgery Before surgery, you can play an important role.  Because skin is not sterile, your skin needs to be as free of germs as possible.  You can reduce the number of germs on your skin by washing with CHG (chlorahexidine gluconate) soap before surgery.  CHG is an antiseptic cleaner which kills  germs and bonds with the skin to continue killing germs even after washing. Please DO NOT use if you have an allergy to CHG or antibacterial soaps.  If your skin becomes reddened/irritated stop using the CHG and inform your nurse when you arrive at Short Stay. Do not shave (including legs and underarms) for at least 48 hours prior to the first CHG shower.  You may shave your face/neck. Please follow these instructions carefully:  1.  Shower with CHG Soap the night before surgery and the  morning of Surgery.  2.  If you choose to wash your hair, wash your hair first as usual with your  normal  shampoo.  3.  After you shampoo, rinse your hair and body thoroughly to remove the  shampoo.  4.  Use CHG as you would any other liquid soap.  You can apply chg directly  to the skin and wash                       Gently with a scrungie or clean washcloth.  5.  Apply the CHG Soap to your body ONLY FROM THE NECK DOWN.   Do not use on face/ open                           Wound or open sores. Avoid contact with eyes, ears mouth and genitals (private parts).                       Wash face,  Genitals (private parts) with your normal soap.             6.  Wash thoroughly, paying special attention to the area where your surgery  will be performed.  7.  Thoroughly rinse your body with warm water from the neck down.  8.  DO NOT shower/wash with your normal soap after using and rinsing off  the CHG Soap.                9.  Pat yourself dry with a clean towel.            10.  Wear clean pajamas.            11.  Place clean sheets on your bed the night of your first shower and do not  sleep with pets. Day of Surgery : Do not apply any lotions/deodorants the morning of surgery.  Please wear clean clothes to the hospital/surgery center.  FAILURE TO FOLLOW THESE INSTRUCTIONS MAY RESULT IN THE CANCELLATION OF YOUR SURGERY PATIENT SIGNATURE_________________________________  NURSE  SIGNATURE__________________________________  ________________________________________________________________________   Adam Phenix  An incentive spirometer is a tool that can help keep your lungs clear and active. This tool measures how well you are filling your lungs with each breath. Taking long deep breaths may help reverse or decrease the chance of developing breathing (pulmonary) problems (especially infection) following:  A long period of time when you are unable to move or be active. BEFORE THE PROCEDURE   If the spirometer includes an indicator to show your best effort, your nurse or respiratory therapist will set it to a desired goal.  If possible, sit up straight or lean slightly forward. Try not to slouch.  Hold the incentive spirometer in an upright position. INSTRUCTIONS FOR USE  1. Sit on the edge of your bed if possible, or sit up as far as you can in bed or on a chair. 2. Hold the incentive spirometer in an upright position. 3. Breathe out normally. 4. Place the mouthpiece in your mouth and seal your lips tightly around it. 5. Breathe in slowly and as deeply as possible, raising the piston or the ball toward the top of the column. 6. Hold your breath for 3-5 seconds or for as long as possible. Allow the piston or ball to fall to the bottom of the column. 7. Remove the mouthpiece from your mouth and breathe out normally. 8. Rest for a few seconds and repeat Steps 1 through 7 at least 10 times every 1-2 hours when you are awake. Take your time and take a few normal breaths between deep breaths. 9. The spirometer may include an indicator to show  your best effort. Use the indicator as a goal to work toward during each repetition. 10. After each set of 10 deep breaths, practice coughing to be sure your lungs are clear. If you have an incision (the cut made at the time of surgery), support your incision when coughing by placing a pillow or rolled up towels firmly  against it. Once you are able to get out of bed, walk around indoors and cough well. You may stop using the incentive spirometer when instructed by your caregiver.  RISKS AND COMPLICATIONS  Take your time so you do not get dizzy or light-headed.  If you are in pain, you may need to take or ask for pain medication before doing incentive spirometry. It is harder to take a deep breath if you are having pain. AFTER USE  Rest and breathe slowly and easily.  It can be helpful to keep track of a log of your progress. Your caregiver can provide you with a simple table to help with this. If you are using the spirometer at home, follow these instructions: [place] IF:   You are having difficultly using the spirometer.  You have trouble using the spirometer as often as instructed.  Your pain medication is not giving enough relief while using the spirometer.  You develop fever of 100.5 F (38.1 C) or higher. SEEK IMMEDIATE MEDICAL CARE IF:   You cough up bloody sputum that had not been present before.  You develop fever of 102 F (38.9 C) or greater.  You develop worsening pain at or near the incision site. MAKE SURE YOU:   Understand these instructions.  Will watch your condition.  Will get help right away if you are not doing well or get worse. Document Released: 08/19/2006 Document Revised: 07/01/2011 Document Reviewed: 10/20/2006 Exit[employer] Patient Information 2014 ExitCare, [place].   ________________________________________________________________________  WHAT IS A BLOOD TRANSFUSION? Blood Transfusion Information  A transfusion is the replacement of blood or some of its parts. Blood is made up of multiple cells which provide different functions.  Red blood cells carry oxygen and are used for blood loss replacement.  White blood cells fight against infection.  Platelets control bleeding.  Plasma helps clot blood.  Other blood products are available for  specialized needs, such as hemophilia or other clotting disorders. BEFORE THE TRANSFUSION  Who gives blood for transfusions?   Healthy volunteers who are fully evaluated to make sure their blood is safe. This is blood bank blood. Transfusion therapy is the safest it has ever been in the practice of medicine. Before blood is taken from a donor, a complete history is taken to make sure that person has no history of diseases nor engages in risky social behavior (examples are intravenous drug use or sexual activity with multiple partners). The donor's travel history is screened to minimize risk of transmitting infections, such as malaria. The donated blood is tested for signs of infectious diseases, such as HIV and hepatitis. The blood is then tested to be sure it is compatible with you in order to minimize the chance of a transfusion reaction. If you or a relative donates blood, this is often done in anticipation of surgery and is not appropriate for emergency situations. It takes many days to process the donated blood. RISKS AND COMPLICATIONS Although transfusion therapy is very safe and saves many lives, the main dangers of transfusion include:   Getting an infectious disease.  Developing a transfusion reaction. This is an allergic reaction to  something in the blood you were given. Every precaution is taken to prevent this. The decision to have a blood transfusion has been considered carefully by your caregiver before blood is given. Blood is not given unless the benefits outweigh the risks. AFTER THE TRANSFUSION  Right after receiving a blood transfusion, you will usually feel much better and more energetic. This is especially true if your red blood cells have gotten low (anemic). The transfusion raises the level of the red blood cells which carry oxygen, and this usually causes an energy increase.  The nurse administering the transfusion will monitor you carefully for complications. HOME CARE  INSTRUCTIONS  No special instructions are needed after a transfusion. You may find your energy is better. Speak with your caregiver about any limitations on activity for underlying diseases you may have. SEEK MEDICAL CARE IF:   Your condition is not improving after your transfusion.  You develop redness or irritation at the intravenous (IV) site. SEEK IMMEDIATE MEDICAL CARE IF:  Any of the following symptoms occur over the next 12 hours:  Shaking chills.  You have a temperature by mouth above 102 F (38.9 C), not controlled by medicine.  Chest, back, or muscle pain.  People around you feel you are not acting correctly or are confused.  Shortness of breath or difficulty breathing.  Dizziness and fainting.  You get a rash or develop hives.  You have a decrease in urine output.  Your urine turns a dark color or changes to pink, red, or brown. Any of the following symptoms occur over the next 10 days:  You have a temperature by mouth above 102 F (38.9 C), not controlled by medicine.  Shortness of breath.  Weakness after normal activity.  The white part of the eye turns yellow (jaundice).  You have a decrease in the amount of urine or are urinating less often.  Your urine turns a dark color or changes to pink, red, or brown. Document Released: 04/05/2000 Document Revised: 07/01/2011 Document Reviewed: 11/23/2007 [facility] Patient Information 2014 [place], [place].  _______________________________________________________________________

## 2016-03-06 ENCOUNTER — Encounter: Payer: Self-pay | Admitting: Gynecologic Oncology

## 2016-03-06 ENCOUNTER — Encounter (HOSPITAL_COMMUNITY)
Admission: RE | Admit: 2016-03-06 | Discharge: 2016-03-06 | Disposition: A | Discharge status: Home / Self Care | Payer: BLUE CROSS/BLUE SHIELD | Source: Ambulatory Visit | Attending: Gynecologic Oncology | Admitting: Gynecologic Oncology

## 2016-03-06 ENCOUNTER — Ambulatory Visit: Payer: BLUE CROSS/BLUE SHIELD | Attending: Gynecologic Oncology | Admitting: Gynecologic Oncology

## 2016-03-06 VITALS — BP 154/82 | HR 61 | Temp 97.8°F | Resp 20 | Ht 65.0 in | Wt 185.4 lb

## 2016-03-06 DIAGNOSIS — I890 Lymphedema, not elsewhere classified: Secondary | ICD-10-CM | POA: Diagnosis not present

## 2016-03-06 DIAGNOSIS — Z888 Allergy status to other drugs, medicaments and biological substances status: Secondary | ICD-10-CM | POA: Diagnosis not present

## 2016-03-06 DIAGNOSIS — C539 Malignant neoplasm of cervix uteri, unspecified: Secondary | ICD-10-CM

## 2016-03-06 DIAGNOSIS — Z9889 Other specified postprocedural states: Secondary | ICD-10-CM | POA: Insufficient documentation

## 2016-03-06 NOTE — Patient Instructions (Signed)
Preparing for your Surgery  Plan for surgery on November 28. 2017 with Dr. Everitt Amber at [facility] will be scheduled for a robotic radical hysterectomy, bilateral salpingectomy, sentinel lymph node biopsy, lymph node dissection.  Pre-operative Testing -You will receive a phone call from presurgical testing at [facility] to arrange for a pre-operative testing appointment before your surgery.  This appointment normally occurs one to two weeks before your scheduled surgery.   -Bring your insurance card, copy of an advanced directive if applicable, medication list  -At that visit, you will be asked to sign a consent for a possible blood transfusion in case a transfusion becomes necessary during surgery.  The need for a blood transfusion is rare but having consent is a necessary part of your care.     -You should not be taking blood thinners or aspirin at least ten days prior to surgery unless instructed by your surgeon.  Day Before Surgery at [facility] will be asked to take in a light diet the day before surgery.  Avoid carbonated beverages.  You will be advised to have nothing to eat or drink after midnight the evening before.     Eat a light diet the day before surgery.  Examples including soups, broths, toast, yogurt, mashed potatoes.  Things to avoid include carbonated beverages (fizzy beverages), raw fruits and raw vegetables, or beans.    If your bowels are filled with gas, your surgeon will have difficulty visualizing your pelvic organs which increases your surgical risks.  Your role in recovery Your role is to become active as soon as directed by your doctor, while still giving yourself time to heal.  Rest when you feel tired. You will be asked to do the following in order to speed your recovery:  - Cough and breathe deeply. This helps toclear and expand your lungs and can prevent pneumonia. You may be given a spirometer to practice deep breathing.  A staff member will show you how to use the spirometer. - Do mild physical activity. Walking or moving your legs help your circulation and body functions return to normal. A staff member will help you when you try to walk and will provide you with simple exercises. Do not try to get up or walk alone the first time. - Actively manage your pain. Managing your pain lets you move in comfort. We will ask you to rate your pain on a scale of zero to 10. It is your responsibility to tell your doctor or nurse where and how much you hurt so your pain can be treated.  Special Considerations -If you are diabetic, you may be placed on insulin after surgery to have closer control over your blood sugars to promote healing and recovery.  This does not mean that you will be discharged on insulin.  If applicable, your oral antidiabetics will be resumed when you are tolerating a solid diet.  -Your final pathology results from surgery should be available by the Friday after surgery and the results will be relayed to you when available.   Blood Transfusion Information WHAT IS A BLOOD TRANSFUSION? A transfusion is the replacement of blood or some of its parts. Blood is made up of multiple cells which provide different functions.  Red blood cells carry oxygen and are used for blood loss replacement.  White blood cells fight against infection.  Platelets control bleeding.  Plasma helps clot blood.  Other blood products are available for specialized needs, such  as hemophilia or other clotting disorders. BEFORE THE TRANSFUSION  Who gives blood for transfusions?   You may be able to donate blood to be used at a later date on yourself (autologous donation).  Relatives can be asked to donate blood. This is generally not any safer than if you have received blood from a stranger. The same precautions are taken to ensure safety when a relative's blood is donated.  Healthy volunteers who are fully evaluated to make  sure their blood is safe. This is blood bank blood. Transfusion therapy is the safest it has ever been in the practice of medicine. Before blood is taken from a donor, a complete history is taken to make sure that person has no history of diseases nor engages in risky social behavior (examples are intravenous drug use or sexual activity with multiple partners). The donor's travel history is screened to minimize risk of transmitting infections, such as malaria. The donated blood is tested for signs of infectious diseases, such as HIV and hepatitis. The blood is then tested to be sure it is compatible with you in order to minimize the chance of a transfusion reaction. If you or a relative donates blood, this is often done in anticipation of surgery and is not appropriate for emergency situations. It takes many days to process the donated blood. RISKS AND COMPLICATIONS Although transfusion therapy is very safe and saves many lives, the main dangers of transfusion include:   Getting an infectious disease.  Developing a transfusion reaction. This is an allergic reaction to something in the blood you were given. Every precaution is taken to prevent this. The decision to have a blood transfusion has been considered carefully by your caregiver before blood is given. Blood is not given unless the benefits outweigh the risks.

## 2016-03-06 NOTE — Progress Notes (Signed)
Consult Note: Gyn-Onc  Consult was requested by Dr. Roselie Awkward for the evaluation of Caitlin Flowers 45 y.o. female  CC:  Chief Complaint  Patient presents with  . Cervical Cancer    Assessment/Plan:  [name]  is a 45 y.o.  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 PET negative for gross extra-cervical disease. I recommend robotic assisted radical hysterectomy, bilateral salpingectomy, and SLN biopsy with lymphadenectomy.  I discussed with the patient that this procedure is associated with considerable risks including blood loss, infection, damage to internal organs, nerve injury (particularly to bladder, which can result in permanent urinary retention requiring catheterization), sexual dysfunction, bowel dysfunction, reoperation, lymphedema, VTE.  I discussed that postoperatively she will require bladder rest for a 1 week period with a foley catheter.  If metastases or high risk factors are identified on surgical staging, we will recommend with chemoradiation for adjuvant therapy.  She has pre-existing unexplained left lower extremity lymphedema. No clear etiology based on imaging. Discussed that this might be made worse with surgery (lymphadenectomy), Will have her see lymphedema clinic postop.  HPI: Caitlin Flowers is a 45 year old [place] woman (speaks Amharic) who is seen in consultation at the request of Dr Roselie Awkward for high grade SCC of the cervix.  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 She was recommended to return for colposcopy but failed to do so.  In August, 2017 she began noticing edema of her left lower extremity. She was evaluated in the ED with a doppler of the lower extremity which was negative for DVT and a CT abdo/pelvis which showed no hydronephrosis or hydroureter, small uterine fibroids, but no lesions to explain her LE edema.  She followed up with Dr Roselie Awkward on 01/10/16  and proceed with colposcopy.   At the time of colposcopy a visible lesion was seen on the anterior lip of the cervix. It was biopsied and returned as high grade squamous cell carcinoma. Endocervical curette revealed CIN III.  The patient is otherwise healthy. SHe has had no prior abdominal surgeries, though has had a hysteroscopic resection of a myoma in the past. She denies HIV exposure and is a non-smoker.  She denies intermenstrual bleeding and abnormal discharge.  Interval Hx:  On 02/07/16 PET/CT was performed and showed no evidence of extracervical spread (including no lymphadenopathy) or explanation for LE lymphedema.  This was originally scheduled for October, 2017, however, the week before surgery, the patient cancelled her surgery based on the information she was provided by her family members.  I informed the patient that delays in treatment of her cancer could upstage her disease or make cure less likely.   Current Meds:  No outpatient encounter prescriptions on file as of 03/06/2016.   No facility-administered encounter medications on file as of 03/06/2016.     Allergy:  Allergies  Allergen Reactions  . Influenza Vaccines Shortness Of Breath    Flu like symptoms     Social Hx:   Social History   Social History  . Marital status: Legally Separated    Spouse name: N/A  . Number of children: N/A  . Years of education: N/A   Occupational History  . Not on file.   Social History Main Topics  . Smoking status: Never Smoker  . Smokeless tobacco: Never Used  . Alcohol use No  . Drug use: No  . Sexual activity: Yes  Birth control/ protection: None   Other Topics Concern  . Not on file   Social History Narrative  . No narrative on file    Past Surgical Hx:  Past Surgical History:  Procedure Laterality Date  . DILATION AND CURETTAGE OF UTERUS  02/11/12   Hysteroscopy with versapoint resection/myomectomy/  . INCISION AND DRAINAGE OF WOUND Right 01/13/2013    Procedure: IRRIGATION AND DEBRIDEMENT OF NAIL BED ABLATION AND FULL THICKNESS SKIN GRAFT ;  Surgeon: Schuyler Amor, MD;  Location: [facility];  Service: Orthopedics;  Laterality: Right;  . NO PAST SURGERIES    . WISDOM TOOTH EXTRACTION      Past Medical Hx:  Past Medical History:  Diagnosis Date  . Anemia   . Fibroid (bleeding) (uterine) 2012  . SVD (spontaneous vaginal delivery)    x 2  . Wears glasses    to read    Past Gynecological History:  SVD x 2 No LMP recorded.  Family Hx:  Family History  Problem Relation Age of Onset  . Hypertension Mother     Review of Systems:  Constitutional  Feels well,    ENT Normal appearing ears and nares bilaterally Skin/Breast  No rash, sores, jaundice, itching, dryness Cardiovascular  No chest pain, shortness of breath, or edema  Pulmonary  No cough or wheeze.  Gastro Intestinal  No nausea, vomitting, or diarrhoea. No bright red blood per rectum, no abdominal pain, change in bowel movement, or constipation.  Genito Urinary  No frequency, urgency, dysuria, no bleeding Musculo Skeletal  + LLE edema 3+ (worse with activity, improved with elevation) Neurologic  No weakness, numbness, change in gait,  Psychology  No depression, anxiety, insomnia.   Vitals:  Blood pressure (!) 154/82, pulse 61, temperature 97.8 F (36.6 C), temperature source Oral, resp. rate 20, height 5\' 5"  (1.651 m), weight 185 lb 6.4 oz (84.1 kg), SpO2 100 %.  Physical Exam: WD in NAD Neck  Supple NROM, without any enlargements.  Lymph Node Survey No cervical supraclavicular or inguinal adenopathy however there is fullness in the left inguinal region. Cardiovascular  Pulse normal rate, regularity and rhythm. S1 and S2 normal.  Lungs  Clear to auscultation bilateraly, without wheezes/crackles/rhonchi. Good air movement.  Skin  No rash/lesions/breakdown  Psychiatry  Alert and oriented to person, place, and time  Abdomen  Normoactive  bowel sounds, abdomen soft, non-tender and nonobese without evidence of hernia.  Back No CVA tenderness Genito Urinary  Vulva/vagina: Normal external female genitalia.  No lesions. No discharge or bleeding.  Bladder/urethra:  No lesions or masses, well supported bladder  Vagina: normal  Cervix: 3+ (<4cm) cm visible friable slightly exophytic lesion consistent with cervical cancer on the ectocervix between 10 o'clock and 1 o'clock anteriorally. No palpable disease in the paracolpos or parametrium.  Uterus: Small, mobile, no parametrial involvement or nodularity.  Adnexa: no palpable masses. Rectal  Good tone, no masses no cul de sac nodularity. No parametrial infiltration appreciated. Extremities  No bilateral cyanosis, clubbing or edema.   [name], MD  03/06/2016, 10:46 AM

## 2016-03-08 ENCOUNTER — Encounter (HOSPITAL_COMMUNITY): Payer: Self-pay

## 2016-03-08 ENCOUNTER — Encounter (HOSPITAL_COMMUNITY)
Admission: RE | Admit: 2016-03-08 | Discharge: 2016-03-08 | Disposition: A | Discharge status: Home / Self Care | Payer: BLUE CROSS/BLUE SHIELD | Source: Ambulatory Visit | Attending: Gynecologic Oncology | Admitting: Gynecologic Oncology

## 2016-03-08 DIAGNOSIS — Z01812 Encounter for preprocedural laboratory examination: Secondary | ICD-10-CM | POA: Insufficient documentation

## 2016-03-08 DIAGNOSIS — C539 Malignant neoplasm of cervix uteri, unspecified: Secondary | ICD-10-CM | POA: Diagnosis not present

## 2016-03-08 LAB — PREGNANCY, URINE: Preg Test, Ur: NEGATIVE

## 2016-03-08 LAB — CBC WITH DIFFERENTIAL/PLATELET
Basophils Absolute: 0 10*3/uL (ref 0.0–0.1)
Basophils Relative: 0 %
EOS ABS: 0.8 10*3/uL — AB (ref 0.0–0.7)
EOS PCT: 11 %
HCT: 37.9 % (ref 36.0–46.0)
Hemoglobin: 12.6 g/dL (ref 12.0–15.0)
LYMPHS ABS: 2.6 10*3/uL (ref 0.7–4.0)
Lymphocytes Relative: 35 %
MCH: 30.4 pg (ref 26.0–34.0)
MCHC: 33.2 g/dL (ref 30.0–36.0)
MCV: 91.3 fL (ref 78.0–100.0)
Monocytes Absolute: 0.5 10*3/uL (ref 0.1–1.0)
Monocytes Relative: 6 %
Neutro Abs: 3.6 10*3/uL (ref 1.7–7.7)
Neutrophils Relative %: 48 %
PLATELETS: 188 10*3/uL (ref 150–400)
RBC: 4.15 MIL/uL (ref 3.87–5.11)
RDW: 12.1 % (ref 11.5–15.5)
WBC: 7.4 10*3/uL (ref 4.0–10.5)

## 2016-03-08 LAB — TYPE AND SCREEN
ABO/RH(D): A POS
ANTIBODY SCREEN: NEGATIVE

## 2016-03-08 LAB — COMPREHENSIVE METABOLIC PANEL
ALT: 13 U/L — AB (ref 14–54)
ANION GAP: 6 (ref 5–15)
AST: 17 U/L (ref 15–41)
Albumin: 4.4 g/dL (ref 3.5–5.0)
Alkaline Phosphatase: 68 U/L (ref 38–126)
BUN: 15 mg/dL (ref 6–20)
CHLORIDE: 108 mmol/L (ref 101–111)
CO2: 27 mmol/L (ref 22–32)
CREATININE: 0.61 mg/dL (ref 0.44–1.00)
Calcium: 9.3 mg/dL (ref 8.9–10.3)
GFR calc non Af Amer: >60 mL/min (ref >60)
Glucose, Bld: 95 mg/dL (ref 65–99)
Potassium: 4.2 mmol/L (ref 3.5–5.1)
SODIUM: 141 mmol/L (ref 135–145)
Total Bilirubin: 0.4 mg/dL (ref 0.3–1.2)
Total Protein: 7.2 g/dL (ref 6.5–8.1)

## 2016-03-08 LAB — URINALYSIS, ROUTINE W REFLEX MICROSCOPIC
BILIRUBIN URINE: NEGATIVE
Glucose, UA: NEGATIVE mg/dL
KETONES UR: NEGATIVE mg/dL
Nitrite: NEGATIVE
PROTEIN: NEGATIVE mg/dL
Specific Gravity, Urine: 1.024 (ref 1.005–1.030)
pH: 6 (ref 5.0–8.0)

## 2016-03-08 LAB — URINE MICROSCOPIC-ADD ON

## 2016-03-08 LAB — ABO/RH: ABO/RH(D): A POS

## 2016-03-08 NOTE — Patient Instructions (Addendum)
Caitlin Flowers  03/08/2016   Your procedure is scheduled on: 03-19-16  Report to [facility] Main  Entrance take [facility]  elevators to 3rd floor to  [place] at   0730  AM.  Call this number if you have problems the morning of surgery [phone]  Eat a light diet the day before surgery. Examples including soups, broths, toast, yogurt, mashed potatoes. Things to avoid include carbonated beverages (fizzy beverages), raw fruits and raw vegetables, or beans.(Drink Clear Liquids plentiful day before).    CLEAR LIQUID DIET   Foods Allowed                                                                     Foods Excluded  Coffee and tea, regular and decaf                             liquids that you cannot  Plain Jell-O in any flavor                                             see through such as: Fruit ices (not with fruit pulp)                                     milk, soups, orange juice  Iced Popsicles                                    All solid food Cranberry, grape and apple juices Sports drinks like Gatorade Lightly seasoned clear broth or consume(fat free) Sugar, honey syrup   _____________________________________________________________________    If your bowels are filled with gas, your surgeon will have difficulty visualizing your pelvic organs which increases your surgical risks.   Remember: ONLY 1 PERSON MAY GO WITH YOU TO SHORT STAY TO GET  READY MORNING OF YOUR SURGERY.  Do not eat food or drink liquids :After Midnight.     Take these medicines the morning of surgery with A SIP OF WATER: NONE DO NOT TAKE ANY DIABETIC MEDICATIONS DAY OF YOUR SURGERY                               You may not have any metal on your body including hair pins and              piercings  Do not wear jewelry, make-up, lotions, powders or perfumes, deodorant             Do not wear nail polish.  Do not shave  48 hours prior to surgery.              Men may  shave face and neck.   Do not bring valuables to the hospital. [facility] IS NOT  RESPONSIBLE   FOR VALUABLES.  Contacts, dentures or bridgework may not be worn into surgery.  Leave suitcase in the car. After surgery it may be brought to your room.     Patients discharged the day of surgery will not be allowed to drive home.  Name and phone number of your driver:  Special Instructions: N/A              Please read over the following fact sheets you were given: _____________________________________________________________________             [facility] - Preparing for Surgery Before surgery, you can play an important role.  Because skin is not sterile, your skin needs to be as free of germs as possible.  You can reduce the number of germs on your skin by washing with CHG (chlorahexidine gluconate) soap before surgery.  CHG is an antiseptic cleaner which kills germs and bonds with the skin to continue killing germs even after washing. Please DO NOT use if you have an allergy to CHG or antibacterial soaps.  If your skin becomes reddened/irritated stop using the CHG and inform your nurse when you arrive at Short Stay. Do not shave (including legs and underarms) for at least 48 hours prior to the first CHG shower.  You may shave your face/neck. Please follow these instructions carefully:  1.  Shower with CHG Soap the night before surgery and the  morning of Surgery.  2.  If you choose to wash your hair, wash your hair first as usual with your  normal  shampoo.  3.  After you shampoo, rinse your hair and body thoroughly to remove the  shampoo.                           4.  Use CHG as you would any other liquid soap.  You can apply chg directly  to the skin and wash                       Gently with a scrungie or clean washcloth.  5.  Apply the CHG Soap to your body ONLY FROM THE NECK DOWN.   Do not use on face/ open                           Wound or open sores. Avoid contact with  eyes, ears mouth and genitals (private parts).                       Wash face,  Genitals (private parts) with your normal soap.             6.  Wash thoroughly, paying special attention to the area where your surgery  will be performed.  7.  Thoroughly rinse your body with warm water from the neck down.  8.  DO NOT shower/wash with your normal soap after using and rinsing off  the CHG Soap.                9.  Pat yourself dry with a clean towel.            10.  Wear clean pajamas.            11.  Place clean sheets on your bed the night of your first shower and do not  sleep with pets. Day of Surgery : Do  not apply any lotions/deodorants the morning of surgery.  Please wear clean clothes to the hospital/surgery center.  FAILURE TO FOLLOW THESE INSTRUCTIONS MAY RESULT IN THE CANCELLATION OF YOUR SURGERY PATIENT SIGNATURE_________________________________  NURSE SIGNATURE__________________________________  ________________________________________________________________________

## 2016-03-08 NOTE — Progress Notes (Signed)
03-08-16 1540 Note urinalysis report, viewable in Epic., note sent per Epic to Dr. Denman George office.

## 2016-03-19 ENCOUNTER — Encounter (HOSPITAL_COMMUNITY)
Admission: RE | Disposition: A | Discharge status: Home / Self Care | Payer: Self-pay | Source: Ambulatory Visit | Attending: Gynecologic Oncology

## 2016-03-19 ENCOUNTER — Ambulatory Visit (HOSPITAL_BASED_OUTPATIENT_CLINIC_OR_DEPARTMENT_OTHER)
Admission: RE | Admit: 2016-03-19 | Discharge: 2016-03-20 | Disposition: A | Discharge status: Home / Self Care | Payer: BLUE CROSS/BLUE SHIELD | Source: Ambulatory Visit | Attending: Gynecologic Oncology | Admitting: Gynecologic Oncology

## 2016-03-19 ENCOUNTER — Encounter (HOSPITAL_COMMUNITY): Payer: Self-pay | Admitting: *Deleted

## 2016-03-19 ENCOUNTER — Ambulatory Visit (HOSPITAL_COMMUNITY): Payer: BLUE CROSS/BLUE SHIELD | Admitting: Anesthesiology

## 2016-03-19 DIAGNOSIS — C539 Malignant neoplasm of cervix uteri, unspecified: Secondary | ICD-10-CM | POA: Diagnosis not present

## 2016-03-19 DIAGNOSIS — R600 Localized edema: Secondary | ICD-10-CM | POA: Diagnosis present

## 2016-03-19 DIAGNOSIS — D630 Anemia in neoplastic disease: Secondary | ICD-10-CM

## 2016-03-19 DIAGNOSIS — Z887 Allergy status to serum and vaccine status: Secondary | ICD-10-CM

## 2016-03-19 HISTORY — PX: ROBOTIC ASSISTED LAP VAGINAL HYSTERECTOMY: SHX2362

## 2016-03-19 HISTORY — PX: ROBOTIC ASSISTED SALPINGO OOPHERECTOMY: SHX6082

## 2016-03-19 HISTORY — PX: SENTINEL NODE BIOPSY: SHX6608

## 2016-03-19 SURGERY — HYSTERECTOMY, VAGINAL, ROBOT-ASSISTED
Anesthesia: General

## 2016-03-19 MED ORDER — ENOXAPARIN SODIUM 40 MG/0.4ML ~~LOC~~ SOLN
40.0000 mg | SUBCUTANEOUS | Status: AC
  Administered 2016-03-19: 40 mg via SUBCUTANEOUS
  Filled 2016-03-19: qty 0.4

## 2016-03-19 MED ORDER — FENTANYL CITRATE (PF) 100 MCG/2ML IJ SOLN
INTRAMUSCULAR | Status: AC
  Filled 2016-03-19: qty 2

## 2016-03-19 MED ORDER — OXYCODONE-ACETAMINOPHEN 5-325 MG PO TABS
1.0000 | ORAL_TABLET | ORAL | Status: DC | PRN
  Administered 2016-03-20 (×2): 1 via ORAL
  Filled 2016-03-19: qty 2
  Filled 2016-03-19: qty 1

## 2016-03-19 MED ORDER — LACTATED RINGERS IV SOLN
INTRAVENOUS | Status: DC
  Administered 2016-03-19 (×3): via INTRAVENOUS

## 2016-03-19 MED ORDER — PROPOFOL 10 MG/ML IV BOLUS
INTRAVENOUS | Status: DC | PRN
  Administered 2016-03-19: 180 mg via INTRAVENOUS

## 2016-03-19 MED ORDER — MIDAZOLAM HCL 5 MG/5ML IJ SOLN
INTRAMUSCULAR | Status: DC | PRN
  Administered 2016-03-19: 2 mg via INTRAVENOUS

## 2016-03-19 MED ORDER — ONDANSETRON HCL 4 MG/2ML IJ SOLN
INTRAMUSCULAR | Status: DC | PRN
  Administered 2016-03-19: 4 mg via INTRAVENOUS

## 2016-03-19 MED ORDER — PROPOFOL 10 MG/ML IV BOLUS
INTRAVENOUS | Status: AC
  Filled 2016-03-19: qty 20

## 2016-03-19 MED ORDER — DEXAMETHASONE SODIUM PHOSPHATE 10 MG/ML IJ SOLN
INTRAMUSCULAR | Status: DC | PRN
  Administered 2016-03-19: 10 mg via INTRAVENOUS

## 2016-03-19 MED ORDER — ONDANSETRON HCL 4 MG PO TABS: 4.0000 mg | ORAL_TABLET | Freq: Four times a day (QID) | ORAL | Status: DC | PRN

## 2016-03-19 MED ORDER — ROCURONIUM BROMIDE 50 MG/5ML IV SOSY
PREFILLED_SYRINGE | INTRAVENOUS | Status: AC
  Filled 2016-03-19: qty 5

## 2016-03-19 MED ORDER — LACTATED RINGERS IR SOLN
Status: DC | PRN
  Administered 2016-03-19: 3000 mL

## 2016-03-19 MED ORDER — CEFAZOLIN SODIUM-DEXTROSE 2-4 GM/100ML-% IV SOLN
INTRAVENOUS | Status: AC
  Filled 2016-03-19: qty 100

## 2016-03-19 MED ORDER — LIDOCAINE 2% (20 MG/ML) 5 ML SYRINGE
INTRAMUSCULAR | Status: AC
  Filled 2016-03-19: qty 5

## 2016-03-19 MED ORDER — LABETALOL HCL 5 MG/ML IV SOLN
INTRAVENOUS | Status: AC
  Filled 2016-03-19: qty 4

## 2016-03-19 MED ORDER — LIDOCAINE 2% (20 MG/ML) 5 ML SYRINGE
INTRAMUSCULAR | Status: DC | PRN
  Administered 2016-03-19: 100 mg via INTRAVENOUS

## 2016-03-19 MED ORDER — HYDROMORPHONE HCL 1 MG/ML IJ SOLN
INTRAMUSCULAR | Status: AC
  Filled 2016-03-19: qty 1

## 2016-03-19 MED ORDER — KCL IN DEXTROSE-NACL 20-5-0.45 MEQ/L-%-% IV SOLN
INTRAVENOUS | Status: DC
  Administered 2016-03-19: 18:00:00 via INTRAVENOUS
  Filled 2016-03-19 (×2): qty 1000

## 2016-03-19 MED ORDER — FENTANYL CITRATE (PF) 100 MCG/2ML IJ SOLN
INTRAMUSCULAR | Status: DC | PRN
  Administered 2016-03-19 (×3): 50 ug via INTRAVENOUS
  Administered 2016-03-19: 100 ug via INTRAVENOUS
  Administered 2016-03-19 (×2): 50 ug via INTRAVENOUS

## 2016-03-19 MED ORDER — SUGAMMADEX SODIUM 200 MG/2ML IV SOLN
INTRAVENOUS | Status: AC
  Filled 2016-03-19: qty 2

## 2016-03-19 MED ORDER — ONDANSETRON HCL 4 MG/2ML IJ SOLN: 4.0000 mg | Freq: Four times a day (QID) | INTRAMUSCULAR | Status: DC | PRN

## 2016-03-19 MED ORDER — FENTANYL CITRATE (PF) 250 MCG/5ML IJ SOLN
INTRAMUSCULAR | Status: AC
  Filled 2016-03-19: qty 5

## 2016-03-19 MED ORDER — STERILE WATER FOR INJECTION IJ SOLN
INTRAMUSCULAR | Status: AC
  Filled 2016-03-19: qty 10

## 2016-03-19 MED ORDER — HYDROMORPHONE HCL 1 MG/ML IJ SOLN
0.2500 mg | INTRAMUSCULAR | Status: DC | PRN
  Administered 2016-03-19 (×2): 0.5 mg via INTRAVENOUS

## 2016-03-19 MED ORDER — LABETALOL HCL 5 MG/ML IV SOLN
INTRAVENOUS | Status: DC | PRN
  Administered 2016-03-19 (×2): 5 mg via INTRAVENOUS

## 2016-03-19 MED ORDER — ROCURONIUM BROMIDE 10 MG/ML (PF) SYRINGE
PREFILLED_SYRINGE | INTRAVENOUS | Status: DC | PRN
  Administered 2016-03-19: 10 mg via INTRAVENOUS
  Administered 2016-03-19: 30 mg via INTRAVENOUS
  Administered 2016-03-19 (×3): 10 mg via INTRAVENOUS
  Administered 2016-03-19: 50 mg via INTRAVENOUS
  Administered 2016-03-19: 10 mg via INTRAVENOUS

## 2016-03-19 MED ORDER — ONDANSETRON HCL 4 MG/2ML IJ SOLN
INTRAMUSCULAR | Status: AC
  Filled 2016-03-19: qty 2

## 2016-03-19 MED ORDER — ENOXAPARIN SODIUM 40 MG/0.4ML ~~LOC~~ SOLN
40.0000 mg | SUBCUTANEOUS | Status: DC
  Administered 2016-03-20: 40 mg via SUBCUTANEOUS
  Filled 2016-03-19: qty 0.4

## 2016-03-19 MED ORDER — KETOROLAC TROMETHAMINE 15 MG/ML IJ SOLN
15.0000 mg | Freq: Four times a day (QID) | INTRAMUSCULAR | Status: AC
  Administered 2016-03-19 – 2016-03-20 (×4): 15 mg via INTRAVENOUS
  Filled 2016-03-19 (×4): qty 1

## 2016-03-19 MED ORDER — IBUPROFEN 800 MG PO TABS
800.0000 mg | ORAL_TABLET | Freq: Three times a day (TID) | ORAL | Status: DC | PRN
Start: 2016-03-20 — End: 2016-03-20
  Filled 2016-03-19: qty 1

## 2016-03-19 MED ORDER — HYDROMORPHONE HCL 1 MG/ML IJ SOLN
0.2000 mg | INTRAMUSCULAR | Status: DC | PRN
  Administered 2016-03-20: 0.5 mg via INTRAVENOUS
  Filled 2016-03-19: qty 1

## 2016-03-19 MED ORDER — GABAPENTIN 300 MG PO CAPS
600.0000 mg | ORAL_CAPSULE | Freq: Every day | ORAL | Status: AC
  Administered 2016-03-19: 600 mg via ORAL
  Filled 2016-03-19 (×2): qty 2

## 2016-03-19 MED ORDER — CEFAZOLIN SODIUM-DEXTROSE 2-4 GM/100ML-% IV SOLN
2.0000 g | INTRAVENOUS | Status: AC
  Administered 2016-03-19: 2 g via INTRAVENOUS
  Filled 2016-03-19: qty 100

## 2016-03-19 MED ORDER — STERILE WATER FOR IRRIGATION IR SOLN
Status: DC | PRN
  Administered 2016-03-19: 1000 mL

## 2016-03-19 MED ORDER — SUGAMMADEX SODIUM 200 MG/2ML IV SOLN
INTRAVENOUS | Status: DC | PRN
  Administered 2016-03-19: 175 mg via INTRAVENOUS

## 2016-03-19 MED ORDER — DEXAMETHASONE SODIUM PHOSPHATE 10 MG/ML IJ SOLN
INTRAMUSCULAR | Status: AC
  Filled 2016-03-19: qty 1

## 2016-03-19 MED ORDER — MIDAZOLAM HCL 2 MG/2ML IJ SOLN
INTRAMUSCULAR | Status: AC
  Filled 2016-03-19: qty 2

## 2016-03-19 SURGICAL SUPPLY — 62 items
APPLICATOR SURGIFLO ENDO (HEMOSTASIS) IMPLANT
BAG LAPAROSCOPIC 12 15 PORT 16 (BASKET) IMPLANT
BAG RETRIEVAL 12/15 (BASKET)
CHLORAPREP W/TINT 26ML (MISCELLANEOUS) ×3 IMPLANT
COVER SURGICAL LIGHT HANDLE (MISCELLANEOUS) ×3 IMPLANT
COVER TIP SHEARS 8 DVNC (MISCELLANEOUS) ×2 IMPLANT
COVER TIP SHEARS 8MM DA VINCI (MISCELLANEOUS) ×1
DERMABOND ADVANCED (GAUZE/BANDAGES/DRESSINGS) ×1
DERMABOND ADVANCED .7 DNX12 (GAUZE/BANDAGES/DRESSINGS) ×2 IMPLANT
DRAPE ARM DVNC X/XI (DISPOSABLE) ×8 IMPLANT
DRAPE COLUMN DVNC XI (DISPOSABLE) ×2 IMPLANT
DRAPE DA VINCI XI ARM (DISPOSABLE) ×4
DRAPE DA VINCI XI COLUMN (DISPOSABLE) ×1
DRAPE SHEET LG 3/4 BI-LAMINATE (DRAPES) ×6 IMPLANT
DRAPE SURG IRRIG POUCH 19X23 (DRAPES) ×3 IMPLANT
ELECT REM PT RETURN 15FT ADLT (MISCELLANEOUS) ×3 IMPLANT
ELECT REM PT RETURN 9FT ADLT (ELECTROSURGICAL) ×3
ELECTRODE REM PT RTRN 9FT ADLT (ELECTROSURGICAL) ×2 IMPLANT
GLOVE BIO SURGEON STRL SZ 6 (GLOVE) ×12 IMPLANT
GLOVE BIO SURGEON STRL SZ 6.5 (GLOVE) ×6 IMPLANT
GOWN STRL REUS W/ TWL LRG LVL3 (GOWN DISPOSABLE) ×6 IMPLANT
GOWN STRL REUS W/TWL LRG LVL3 (GOWN DISPOSABLE) ×3
HOLDER FOLEY CATH W/STRAP (MISCELLANEOUS) ×3 IMPLANT
IRRIG SUCT STRYKERFLOW 2 WTIP (MISCELLANEOUS) ×3
IRRIGATION SUCT STRKRFLW 2 WTP (MISCELLANEOUS) ×2 IMPLANT
KIT BASIN OR (CUSTOM PROCEDURE TRAY) ×3 IMPLANT
KIT PROCEDURE DA VINCI SI (MISCELLANEOUS)
KIT PROCEDURE DVNC SI (MISCELLANEOUS) IMPLANT
MANIPULATOR UTERINE 4.5 ZUMI (MISCELLANEOUS) IMPLANT
MARKER SKIN DUAL TIP RULER LAB (MISCELLANEOUS) ×3 IMPLANT
NDL SAFETY ECLIPSE 18X1.5 (NEEDLE) IMPLANT
NEEDLE HYPO 18GX1.5 SHARP (NEEDLE)
NEEDLE SPNL 18GX3.5 QUINCKE PK (NEEDLE) IMPLANT
OBTURATOR OPTICAL STANDARD 8MM (TROCAR) ×1
OBTURATOR OPTICAL STND 8 DVNC (TROCAR) ×2
OBTURATOR OPTICALSTD 8 DVNC (TROCAR) ×2 IMPLANT
OCCLUDER COLPOPNEUMO (BALLOONS) ×3 IMPLANT
PAD POSITIONING PINK XL (MISCELLANEOUS) ×3 IMPLANT
PORT ACCESS TROCAR AIRSEAL 12 (TROCAR) ×2 IMPLANT
PORT ACCESS TROCAR AIRSEAL 5M (TROCAR) ×1
POUCH SPECIMEN RETRIEVAL 10MM (ENDOMECHANICALS) ×6 IMPLANT
SEAL CANN UNIV 5-8 DVNC XI (MISCELLANEOUS) ×8 IMPLANT
SEAL XI 5MM-8MM UNIVERSAL (MISCELLANEOUS) ×4
SET TRI-LUMEN FLTR TB AIRSEAL (TUBING) ×3 IMPLANT
SHEET LAVH (DRAPES) ×3 IMPLANT
SLEEVE SURGEON STRL (DRAPES) ×3 IMPLANT
SOLUTION ELECTROLUBE (MISCELLANEOUS) ×3 IMPLANT
SURGIFLO W/THROMBIN 8M KIT (HEMOSTASIS) IMPLANT
SUT MNCRL AB 4-0 PS2 18 (SUTURE) ×6 IMPLANT
SUT VIC AB 0 CT1 27 (SUTURE) ×1
SUT VIC AB 0 CT1 27XBRD ANTBC (SUTURE) ×2 IMPLANT
SYR 10ML LL (SYRINGE) ×3 IMPLANT
SYR 50ML LL SCALE MARK (SYRINGE) ×3 IMPLANT
TOWEL OR 17X26 10 PK STRL BLUE (TOWEL DISPOSABLE) ×6 IMPLANT
TOWEL OR NON WOVEN STRL DISP B (DISPOSABLE) ×3 IMPLANT
TRAP SPECIMEN MUCOUS 40CC (MISCELLANEOUS) IMPLANT
TRAY FOLEY W/METER SILVER 16FR (SET/KITS/TRAYS/PACK) ×3 IMPLANT
TRAY LAPAROSCOPIC (CUSTOM PROCEDURE TRAY) ×3 IMPLANT
TROCAR BLADELESS OPT 5 100 (ENDOMECHANICALS) ×3 IMPLANT
UNDERPAD 30X30 (UNDERPADS AND DIAPERS) ×3 IMPLANT
UNDERPAD 30X30 INCONTINENT (UNDERPADS AND DIAPERS) ×3 IMPLANT
WATER STERILE IRR 1500ML POUR (IV SOLUTION) IMPLANT

## 2016-03-19 NOTE — Anesthesia Postprocedure Evaluation (Signed)
Anesthesia Post Note  Patient: Idara Reesor  Procedure(s) Performed: Procedure(s) (LRB): XI ROBOTIC ASSISTED LAPAROSCOPIC RADICAL HYSTERECTOMY (N/A) XI ROBOTIC ASSISTED SALPINGO OOPHORECTOMY (Bilateral) PELVIC LYMPH NODE BIOPSY (Bilateral)  Patient location during evaluation: PACU Anesthesia Type: General Level of consciousness: sedated, oriented and patient cooperative Pain management: pain level controlled Vital Signs Assessment: post-procedure vital signs reviewed and stable Respiratory status: spontaneous breathing, nonlabored ventilation and respiratory function stable Cardiovascular status: blood pressure returned to baseline and stable Postop Assessment: no signs of nausea or vomiting Anesthetic complications: no    Last Vitals:  Vitals:   03/19/16 1615 03/19/16 1630  BP: 139/81   Pulse: 67 72  Resp: 11 10  Temp:  36.6 C    Last Pain:  Vitals:   03/19/16 1630  TempSrc:   PainSc: Asleep                 Sheylin Scharnhorst,E. Chasiti Waddington

## 2016-03-19 NOTE — Interval H&P Note (Signed)
History and Physical Interval Note:  03/19/2016 10:27 AM  Caitlin Flowers  has presented today for surgery, with the diagnosis of CERVICAL CANCER  The various methods of treatment have been discussed with the patient and family. After consideration of risks, benefits and other options for treatment, the patient has consented to  Procedure(s): XI ROBOTIC [place] (N/A) XI ROBOTIC ASSISTED SALPINGO OOPHORECTOMY (Bilateral) SENTINEL NODE BIOPSY (N/A) as a surgical intervention .  The patient's history has been reviewed, patient examined, no change in status, stable for surgery.  I have reviewed the patient's chart and labs.  Questions were answered to the patient's satisfaction.     [name]

## 2016-03-19 NOTE — Anesthesia Procedure Notes (Signed)
Procedure Name: Intubation Date/Time: 03/19/2016 11:13 AM Performed by: Noralyn Pick D Pre-anesthesia Checklist: Patient identified, Emergency Drugs available, Suction available and Patient being monitored Patient Re-evaluated:Patient Re-evaluated prior to inductionOxygen Delivery Method: Circle system utilized Preoxygenation: Pre-oxygenation with 100% oxygen Intubation Type: IV induction Ventilation: Mask ventilation without difficulty Laryngoscope Size: Mac and 3 Grade View: Grade I Tube type: Oral Tube size: 7.5 mm Number of attempts: 1 Airway Equipment and Method: Stylet Placement Confirmation: ETT inserted through vocal cords under direct vision,  positive ETCO2 and breath sounds checked- equal and bilateral Secured at: 22 cm Tube secured with: Tape Dental Injury: Teeth and Oropharynx as per pre-operative assessment

## 2016-03-19 NOTE — Op Note (Signed)
OPERATIVE NOTE 03/19/16  Surgeon: [name]   Assistants: Lahoma Crocker, MD (an MD assistant was necessary for tissue manipulation, management of robotic instrumentation, retraction and positioning due to the complexity of the case and hospital policies).   Anesthesia: General endotracheal anesthesia  ASA Class: 3   Pre-operative Diagnosis: stage IB cervical cancer  Post-operative Diagnosis: same  Operation: Robotic-assisted type III radical laparoscopic hysterectomy with bilateral salpingectomy and bilateral pelvic lymphadenectomy  Surgeon: [name]  Assistant Surgeon: Lahoma Crocker MD  Anesthesia: GET  Urine Output: 500  Operative Findings:  : 6cm normal appearing uterus, normal tubes and ovaries, no supsicious lymph nodes. 3cm anterior cervical tumor.  Estimated Blood Loss:  less than 100 mL      Total IV Fluids: 1,000 ml         Specimens: uterus with parametrium, cervix and upper vagina. Anterior vaginal margin with marking stitch at 12 o'clock anterior true new distal vaginal margin.         Complications:  None; patient tolerated the procedure well.         Disposition: PACU - hemodynamically stable.  Procedure Details  The patient was seen in the Holding Room. The risks, benefits, complications, treatment options, and expected outcomes were discussed with the patient.  The patient concurred with the proposed plan, giving informed consent.  The site of surgery properly noted/marked. The patient was identified as Company secretary and the procedure verified as a Robotic-assisted radical hysterectomy with bilateral salpingectomy and bilateral pelvic lymphadenectomy. A Time Out was held and the above information confirmed.  After induction of anesthesia, the patient was draped and prepped in the usual sterile manner. Pt was placed in supine position after anesthesia and draped and prepped in the usual sterile manner. The abdominal drape was  placed after the CholoraPrep had been allowed to dry for 3 minutes.  Her arms were tucked to her side with all appropriate precautions.  The chest was secured to the table.  The patient was placed in the semi-lithotomy position in [place].  The perineum was prepped with Betadine.  Foley catheter was placed.  An EEA sizer (large size) was placed vaginally to define the vaginal fornices.  A second time-out was performed.  OG tube placement was confirmed and to suction.   Procedure:  The patient was brought to the operating room where general anesthesia was administered with no complications.  The patient was placed in the dorsal lithotomy position in padded Allen stirrups.  The arms were tucked at the sides with gel pads protecting the elbows and foam protecting the hands. The patient was then prepped.  A Foley was placed to gravity. An EEA sizer was placed in the vaginal fornices.  The patient was then draped in the normal manner.  Next, a 5 mm skin incision was made 1 cm below the subcostal margin in the midclavicular line.  The 5 mm Optiview port and scope was used for direct entry.  Opening pressure was under 10 mm CO2.  The abdomen was insufflated and the findings were noted as above.   At this point and all points during the procedure, the patient's intra-abdominal pressure did not exceed 15 mmHg. Next, a 10 mm skin incision was made at the umbilicus and a right and left port was placed about 10 cm lateral to the robot port on the right and left side.  A fourth arm was placed in the left lower quadrant 2 cm above and superior  and medial to the anterior superior iliac spine.  All ports were placed under direct visualization.  The patient was placed in steep Trendelenburg.  Bowel was away into the upper abdomen.  The robot was docked in the normal manner.  The right retroperitoneum in the pelvis was opened parallel to the IP ligament. The right paravesical space was developed with monopolar and sharp  dissection. It was held open with tension on the median umbilical ligament with the forth arm. The pararectal space was opened with blunt and sharp dissection to mobilize the ureter off of the medial surface of the internal iliac artery. The medial leaf of the broad ligament containing the ureter was held medially (opening the pararectal space) by the assistant's grasper. The right pelvic lymphadenectomy was performed by skeletonizing the internal iliac artery at the bifurcation with the external iliac artery. The obturator nerve was identified in the base of lateral paravesical space. The ureter was mobilized medially off of the dissection by developing the pararectal space. The genitofemoral nerve was identified, skeletonized and mobilized laterally off of the external iliac artery. An enbloc resection of lymph nodes was performed within the following boundaries: the mid portion of the common iliac proximally, the circumflex iliac vein distally, the obturator nerve posteriorally, the genitofemoral nerve laterally. The nodal basin (including obturator space) were confirmed to be empty of nodes and hemostatic. The nodes were placed in an endocatch bag and retrieved at the end of the procedure vaginally.  The same procedure with the same steps was performed on the patient's left side. The left retroperitoneum in the pelvis was opened parallel to the IP ligament. The left paravesical space was developed with monopolar and sharp dissection. It was held open with tension on the median umbilical ligament with the forth arm. The pararectal space was opened with blunt and sharp dissection to mobilize the ureter off of the medial surface of the internal iliac artery. The medial leaf of the broad ligament containing the ureter was held medially (opening the pararectal space) by the assistant's grasper. The left pelvic lymphadenectomy was performed by skeletonizing the internal iliac artery at the bifurcation with the  external iliac artery. The obturator nerve was identified in the base of lateral paravesical space. The ureter was mobilized medially off of the dissection by developing the pararectal space. The genitofemoral nerve was identified, skeletonized and mobilized laterally off of the external iliac artery. An enbloc resection of lymph nodes was performed within the following boundaries: the mid portion of the common iliac proximally, the circumflex iliac vein distally, the obturator nerve posteriorally, the genitofemoral nerve laterally. The nodal basin (including obturator space) were confirmed to be empty of nodes and hemostatic. The nodes were placed in an endocatch bag and retrieved at the end of the procedure vaginally.  The radical hysterectomy was begun by first skeletonizing the right uterine artery at its origin from the internal iliac artery by skeletonizing it 360 degrees and defining the parauterine web between the paravesical and pararectal spaces. The right ureter was skeletonized off of its attachments to the broad ligament and mobilized laterally. Using meticulous blunt and sparing monopolar dissection in short controlled bursts, the right ureter was untunnelled from under the right uterine artery. The anterior vessicouterine ligament was developed and the bladder flap was taken down to below the level of the tumor and below the rounded portion of the EEA sizer. This then definied the anterior bladder pillar. The uterine artery was bipolar fulgarated to seal it, then  transected. The uterine vein was also sealed and resected. The lateral boundary of the parametrium was taken down with biploar and monopolar dissection to the level of the deep vaginal vein. The uterine vessels were retracted superior and medially over the ureter on the right. The ureter was untunnelled through to its entry into the bladder with meticulous sharp dissection and short bursts of monopolar energy. The ureter was dissected off  of its attachments to the anterior vagina. The anterior bladder pillar was skeletonized and sealed with bipolar energy while the ureter was deflected distally. The posterior bladder pillar was also dissected with monopolar scissors from the upper vagina.  The rectovaginal septum was entered posteriorally with sharp dissection and the rectum was dissected off of the vagina. A window was created in the broad ligament with care to mobilize the ureter laterally. This skeletonized the IP ligament which was sealed with bipolar energy and transected.The right uterosacral ligament was transected with bipolar and monopolar energy 1/2 to 2/3rds of the way towards the uterosacral ligament insertion into the sacrum. In doing so meticulous attention was made to identify and wherever possible, spare the hypogastric nerves. The right paravaginal tissues were tubularized around the vagina on the right at the inferior boundary of the dissection.  The left radical hysterectomy was then performed by first skeletonizing the left uterine artery at its origin from the internal iliac artery by skeletonizing it 360 degrees and defining the parauterine web between the paravesical and pararectal spaces. The left ureter was skeletonized off of its attachments to the broad ligament and mobilized laterally. Using meticulous blunt and sparing monopolar dissection in short controlled bursts, the left ureter was untunnelled from under the left uterine artery. The left anterior vessicouterine ligament was developed and the bladder flap was taken down to below the level of the tumor and below the rounded portion of the EEA sizer. This then definied the anterior bladder pillar. The uterine artery was bipolar fulgarated to seal it, then transected. The uterine vein was also sealed and resected. The lateral boundary of the parametrium was taken down with biploar and monopolar dissection to the level of the deep vaginal vein. The uterine vessels  were retracted superior and medially over the ureter on the right. The ureter was untunnelled through to its entry into the bladder with meticulous sharp dissection and short bursts of monopolar energy. The ureter was dissected off of its attachments to the anterior vagina. The anterior bladder pillar was skeletonized and sealed with bipolar energy while the ureter was deflected distally. The posterior bladder pillar was also dissected with monopolar scissors from the upper vagina.  The rectovaginal septum was entered posteriorally with sharp dissection and the rectum was dissected off of the vagina. A window was created in the broad ligament with care to mobilize the ureter laterally. This skeletonized the IP ligament which was sealed with bipolar energy and transected.The left uterosacral ligament was transected with bipolar and monopolar energy 1/2 to 2/3rds of the way towards the uterosacral ligament insertion into the sacrum. In doing so meticulous attention was made to identify and wherever possible, spare the hypogastric nerves. The left paravaginal tissues were tubularized around the vagina on the left at the inferior boundary of the dissection.  The colpotomy was made and the uterus, cervix, bilateral ovaries and tubes were amputated and delivered through the vagina.  The specimen was inspected and the tumor appeared close to the anterior vaginal margin visually (though not grossly involved). Therefore an additional vaginal  margin was obtained from the anterior vaginal cuff by dissecting the bladder further from the the vagina and using the endoshears to take an additional 1.5cm margin of anterior vaginal tissue. A marking stitch was placed at the distal midline edge to represent anterior midline of the true new distal anterior vaginal margin. Pedicles were inspected and excellent hemostasis was achieved.    The colpotomy at the vaginal cuff was closed with two 0-Vicryl on a CT1 needle in a running  manner.  Irrigation was used and excellent hemostasis was achieved.  At this point in the procedure was completed.  Robotic instruments were removed under direct visulaization.  The robot was undocked. The 10 mm ports were closed with Vicryl on a UR-5 needle and the fascia was closed with 0 Vicryl on a UR-5 needle.  The skin was closed with 4-0 Vicryl in a subcuticular manner.  Dermabond was applied.  Sponge, lap and needle counts correct x 2.  The patient was taken to the recovery room in stable condition.  The vagina was swabbed with  minimal bleeding noted.   All instrument and needle counts were correct x  3.   The patient was transferred to the recovery room in a stable condition.  [name], MD

## 2016-03-19 NOTE — Anesthesia Preprocedure Evaluation (Addendum)
Anesthesia Evaluation  Patient identified by MRN, date of birth, ID band Patient awake    Reviewed: Allergy & Precautions, H&P , NPO status , Patient's Chart, lab work & pertinent test results  Airway Mallampati: II  TM Distance: >3 FB Neck ROM: Full    Dental no notable dental hx. (+) Teeth Intact, Dental Advisory Given   Pulmonary neg pulmonary ROS,    Pulmonary exam normal breath sounds clear to auscultation       Cardiovascular negative cardio ROS   Rhythm:Regular Rate:Normal     Neuro/Psych negative neurological ROS  negative psych ROS   GI/Hepatic negative GI ROS, Neg liver ROS,   Endo/Other  negative endocrine ROS  Renal/GU negative Renal ROS  negative genitourinary   Musculoskeletal   Abdominal   Peds  Hematology negative hematology ROS (+) anemia ,   Anesthesia Other Findings   Reproductive/Obstetrics negative OB ROS                            Anesthesia Physical Anesthesia Plan  ASA: II  Anesthesia Plan: General   Post-op Pain Management:    Induction: Intravenous  Airway Management Planned: Oral ETT  Additional Equipment:   Intra-op Plan:   Post-operative Plan: Extubation in OR  Informed Consent: I have reviewed the patients History and Physical, chart, labs and discussed the procedure including the risks, benefits and alternatives for the proposed anesthesia with the patient or authorized representative who has indicated his/her understanding and acceptance.   Dental advisory given  Plan Discussed with: CRNA  Anesthesia Plan Comments:         Anesthesia Quick Evaluation

## 2016-03-19 NOTE — H&P (View-Only) (Signed)
Consult Note: Gyn-Onc  Consult was requested by Dr. Roselie Awkward for the evaluation of Caitlin Flowers 45 y.o. female  CC:  Chief Complaint  Patient presents with  . Cervical Cancer    Assessment/Plan:  [name]  is a 45 y.o.  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 PET negative for gross extra-cervical disease. I recommend robotic assisted radical hysterectomy, bilateral salpingectomy, and SLN biopsy with lymphadenectomy.  I discussed with the patient that this procedure is associated with considerable risks including blood loss, infection, damage to internal organs, nerve injury (particularly to bladder, which can result in permanent urinary retention requiring catheterization), sexual dysfunction, bowel dysfunction, reoperation, lymphedema, VTE.  I discussed that postoperatively she will require bladder rest for a 1 week period with a foley catheter.  If metastases or high risk factors are identified on surgical staging, we will recommend with chemoradiation for adjuvant therapy.  She has pre-existing unexplained left lower extremity lymphedema. No clear etiology based on imaging. Discussed that this might be made worse with surgery (lymphadenectomy), Will have her see lymphedema clinic postop.  HPI: Caitlin Flowers is a 45 year old [place] woman (speaks Amharic) who is seen in consultation at the request of Dr Roselie Awkward for high grade SCC of the cervix.  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 She was recommended to return for colposcopy but failed to do so.  In August, 2017 she began noticing edema of her left lower extremity. She was evaluated in the ED with a doppler of the lower extremity which was negative for DVT and a CT abdo/pelvis which showed no hydronephrosis or hydroureter, small uterine fibroids, but no lesions to explain her LE edema.  She followed up with Dr Roselie Awkward on 01/10/16  and proceed with colposcopy.   At the time of colposcopy a visible lesion was seen on the anterior lip of the cervix. It was biopsied and returned as high grade squamous cell carcinoma. Endocervical curette revealed CIN III.  The patient is otherwise healthy. SHe has had no prior abdominal surgeries, though has had a hysteroscopic resection of a myoma in the past. She denies HIV exposure and is a non-smoker.  She denies intermenstrual bleeding and abnormal discharge.  Interval Hx:  On 02/07/16 PET/CT was performed and showed no evidence of extracervical spread (including no lymphadenopathy) or explanation for LE lymphedema.  This was originally scheduled for October, 2017, however, the week before surgery, the patient cancelled her surgery based on the information she was provided by her family members.  I informed the patient that delays in treatment of her cancer could upstage her disease or make cure less likely.   Current Meds:  No outpatient encounter prescriptions on file as of 03/06/2016.   No facility-administered encounter medications on file as of 03/06/2016.     Allergy:  Allergies  Allergen Reactions  . Influenza Vaccines Shortness Of Breath    Flu like symptoms     Social Hx:   Social History   Social History  . Marital status: Legally Separated    Spouse name: N/A  . Number of children: N/A  . Years of education: N/A   Occupational History  . Not on file.   Social History Main Topics  . Smoking status: Never Smoker  . Smokeless tobacco: Never Used  . Alcohol use No  . Drug use: No  . Sexual activity: Yes  Birth control/ protection: None   Other Topics Concern  . Not on file   Social History Narrative  . No narrative on file    Past Surgical Hx:  Past Surgical History:  Procedure Laterality Date  . DILATION AND CURETTAGE OF UTERUS  02/11/12   Hysteroscopy with versapoint resection/myomectomy/  . INCISION AND DRAINAGE OF WOUND Right 01/13/2013    Procedure: IRRIGATION AND DEBRIDEMENT OF NAIL BED ABLATION AND FULL THICKNESS SKIN GRAFT ;  Surgeon: Schuyler Amor, MD;  Location: [facility];  Service: Orthopedics;  Laterality: Right;  . NO PAST SURGERIES    . WISDOM TOOTH EXTRACTION      Past Medical Hx:  Past Medical History:  Diagnosis Date  . Anemia   . Fibroid (bleeding) (uterine) 2012  . SVD (spontaneous vaginal delivery)    x 2  . Wears glasses    to read    Past Gynecological History:  SVD x 2 No LMP recorded.  Family Hx:  Family History  Problem Relation Age of Onset  . Hypertension Mother     Review of Systems:  Constitutional  Feels well,    ENT Normal appearing ears and nares bilaterally Skin/Breast  No rash, sores, jaundice, itching, dryness Cardiovascular  No chest pain, shortness of breath, or edema  Pulmonary  No cough or wheeze.  Gastro Intestinal  No nausea, vomitting, or diarrhoea. No bright red blood per rectum, no abdominal pain, change in bowel movement, or constipation.  Genito Urinary  No frequency, urgency, dysuria, no bleeding Musculo Skeletal  + LLE edema 3+ (worse with activity, improved with elevation) Neurologic  No weakness, numbness, change in gait,  Psychology  No depression, anxiety, insomnia.   Vitals:  Blood pressure (!) 154/82, pulse 61, temperature 97.8 F (36.6 C), temperature source Oral, resp. rate 20, height 5\' 5"  (1.651 m), weight 185 lb 6.4 oz (84.1 kg), SpO2 100 %.  Physical Exam: WD in NAD Neck  Supple NROM, without any enlargements.  Lymph Node Survey No cervical supraclavicular or inguinal adenopathy however there is fullness in the left inguinal region. Cardiovascular  Pulse normal rate, regularity and rhythm. S1 and S2 normal.  Lungs  Clear to auscultation bilateraly, without wheezes/crackles/rhonchi. Good air movement.  Skin  No rash/lesions/breakdown  Psychiatry  Alert and oriented to person, place, and time  Abdomen  Normoactive  bowel sounds, abdomen soft, non-tender and nonobese without evidence of hernia.  Back No CVA tenderness Genito Urinary  Vulva/vagina: Normal external female genitalia.  No lesions. No discharge or bleeding.  Bladder/urethra:  No lesions or masses, well supported bladder  Vagina: normal  Cervix: 3+ (<4cm) cm visible friable slightly exophytic lesion consistent with cervical cancer on the ectocervix between 10 o'clock and 1 o'clock anteriorally. No palpable disease in the paracolpos or parametrium.  Uterus: Small, mobile, no parametrial involvement or nodularity.  Adnexa: no palpable masses. Rectal  Good tone, no masses no cul de sac nodularity. No parametrial infiltration appreciated. Extremities  No bilateral cyanosis, clubbing or edema.   [name], MD  03/06/2016, 10:46 AM

## 2016-03-19 NOTE — Transfer of Care (Signed)
Immediate Anesthesia Transfer of Care Note  Patient: Caitlin Flowers  Procedure(s) Performed: Procedure(s): XI ROBOTIC ASSISTED LAPAROSCOPIC RADICAL HYSTERECTOMY (N/A) XI ROBOTIC ASSISTED SALPINGO OOPHORECTOMY (Bilateral) PELVIC LYMPH NODE BIOPSY (Bilateral)  Patient Location: PACU  Anesthesia Type:General  Level of Consciousness: awake, alert  and oriented  Airway & Oxygen Therapy: Patient Spontanous Breathing and Patient connected to face mask oxygen  Post-op Assessment: Report given to RN and Post -op Vital signs reviewed and stable  Post vital signs: Reviewed and stable  Last Vitals:  Vitals:   03/19/16 0801  BP: (!) 156/86  Pulse: 62  Resp: 18  Temp: 36.9 C    Last Pain:  Vitals:   03/19/16 0807  TempSrc:   PainSc: 2       Patients Stated Pain Goal: 3 ([id number])  Complications: No apparent anesthesia complications

## 2016-03-20 LAB — BASIC METABOLIC PANEL
ANION GAP: 7 (ref 5–15)
BUN: 9 mg/dL (ref 6–20)
CHLORIDE: 105 mmol/L (ref 101–111)
CO2: 26 mmol/L (ref 22–32)
Calcium: 9 mg/dL (ref 8.9–10.3)
Creatinine, Ser: 0.63 mg/dL (ref 0.44–1.00)
Glucose, Bld: 130 mg/dL — ABNORMAL HIGH (ref 65–99)
POTASSIUM: 4.9 mmol/L (ref 3.5–5.1)
SODIUM: 138 mmol/L (ref 135–145)

## 2016-03-20 LAB — CBC
HCT: 37.1 % (ref 36.0–46.0)
Hemoglobin: 12.4 g/dL (ref 12.0–15.0)
MCH: 30.9 pg (ref 26.0–34.0)
MCHC: 33.4 g/dL (ref 30.0–36.0)
MCV: 92.5 fL (ref 78.0–100.0)
Platelets: 218 K/uL (ref 150–400)
RBC: 4.01 MIL/uL (ref 3.87–5.11)
RDW: 12.4 % (ref 11.5–15.5)
WBC: 9.7 K/uL (ref 4.0–10.5)

## 2016-03-20 MED ORDER — OXYCODONE-ACETAMINOPHEN 5-325 MG PO TABS: 1.0000 | ORAL_TABLET | ORAL | 0 refills | Status: DC | PRN

## 2016-03-20 MED ORDER — SENNA 8.6 MG PO TABS: 1.0000 | ORAL_TABLET | Freq: Every day | ORAL | 0 refills | Status: DC

## 2016-03-20 MED ORDER — IBUPROFEN 800 MG PO TABS: 800.0000 mg | ORAL_TABLET | Freq: Three times a day (TID) | ORAL | 0 refills | Status: DC | PRN

## 2016-03-20 NOTE — Progress Notes (Signed)
Pt discharged via wheelchair home with daughter in stable condition. Discharge instructions and scripts given. Pt and daughter verbalized understanding. Foley catheter teaching done.  Leg bag placed on patient.  Pt and daughter expressed understanding. No immediate questions or concerns.

## 2016-03-20 NOTE — Discharge Summary (Signed)
Physician Discharge Summary  Patient ID: Caitlin Flowers MRN: [id number] DOB/AGE: September 25, 1970 45 y.o.  Admit date: 03/19/2016 Discharge date: 03/20/2016  Admission Diagnoses: Primary cervical cancer [facility])  Discharge Diagnoses:  Principal Problem:   Primary cervical cancer [facility]) Active Problems:   Edema of left lower extremity   Cervical cancer [facility])   Discharged Condition: good  Hospital Course:  1/ patient admitted on 03/19/16 for robotic assisted radical hysterectomy, bilateral salpingectomy and bilateral pelvic lymphadenectomy for stage IB cervical cancer. 2/ uncomplicated surgery, EBL 100cc. 3/ patient meeting discharge criteria on POD 1. 4/ plan is for patient to be discharged home with foley x 1 week and return to office for voiding trial at that time.  Foley in situ, and patient will be discharged with foley due to radical parametrial resection and risk for urinary retention. 5/ bloody incision wound at umbilicus to be redressed prior to discharge - no active bleeding  Consults: None  Significant Diagnostic Studies: labs:  CBC    Component Value Date/Time   WBC 9.7 03/20/2016 0413   RBC 4.01 03/20/2016 0413   HGB 12.4 03/20/2016 0413   HCT 37.1 03/20/2016 0413   PLT 218 03/20/2016 0413   MCV 92.5 03/20/2016 0413   MCH 30.9 03/20/2016 0413   MCHC 33.4 03/20/2016 0413   RDW 12.4 03/20/2016 0413   LYMPHSABS 2.6 03/08/2016 1124   MONOABS 0.5 03/08/2016 1124   EOSABS 0.8 (H) 03/08/2016 1124   BASOSABS 0.0 03/08/2016 1124   BMET    Component Value Date/Time   NA 138 03/20/2016 0413   K 4.9 03/20/2016 0413   CL 105 03/20/2016 0413   CO2 26 03/20/2016 0413   GLUCOSE 130 (H) 03/20/2016 0413   BUN 9 03/20/2016 0413   CREATININE 0.63 03/20/2016 0413   CALCIUM 9.0 03/20/2016 0413   GFRNONAA >60 03/20/2016 0413   GFRAA >60 03/20/2016 0413    Treatments: surgery: see above  Discharge Exam: Blood pressure 131/66, pulse 69, temperature 99.3 F (37.4 C), temperature  source Oral, resp. rate 14, height 5\' 4"  (1.626 m), weight 187 lb (84.8 kg), last menstrual period 02/06/2016, SpO2 100 %. General appearance: alert and cooperative Resp: clear to auscultation bilaterally Chest wall: no tenderness GI: soft, non-tender; bowel sounds normal; no masses,  no organomegaly Incision/Wound: bloody dressing at umbilicus with no active bleeding.  Disposition: 01-Home or Self Care     Medication List    TAKE these medications   ibuprofen 800 MG tablet Commonly known as:  ADVIL,MOTRIN Take 1 tablet (800 mg total) by mouth every 8 (eight) hours as needed (mild pain).   oxyCODONE-acetaminophen 5-325 MG tablet Commonly known as:  PERCOCET/ROXICET Take 1-2 tablets by mouth every 4 (four) hours as needed (moderate to severe pain (when tolerating fluids)).   senna 8.6 MG Tabs tablet Commonly known as:  SENOKOT Take 1 tablet (8.6 mg total) by mouth at bedtime. Use while taking percocet or if constipated      Follow-up Information    CROSS, MELISSA DEAL, NP Follow up on 03/27/2016.   Specialty:  Gynecologic Oncology Why:  at 10 am for foley catheter removal at the [facility] information: [place] [address] [phone]        [name], MD Follow up on 04/08/2016.   Specialty:  Obstetrics and Gynecology Why:  at 10:45am at the [facility] for post-op follow up Contact information: [place] [address] [phone]  Signed: [name] 03/20/2016, 10:50 AM

## 2016-03-20 NOTE — Discharge Instructions (Signed)
03/20/2016  Return to work: 4-6 weeks if applicable  Activity: 1. Be up and out of the bed during the day.  Take a nap if needed.  You may walk up steps but be careful and use the hand rail.  Stair climbing will tire you more than you think, you may need to stop part way and rest.   2. No lifting or straining for 6 weeks.  3. No driving for 1 week(s).  Do not drive if you are taking narcotic pain medicine.  4. Shower daily.  Use soap and water on your incision and pat dry; don't rub.  No tub baths until cleared by your surgeon.   5. No sexual activity and nothing in the vagina for 8 weeks.  6. You may experience a small amount of clear drainage from your incisions, which is normal.  If the drainage persists or increases, please call the office.  7. You may experience vaginal spotting after surgery or around the 6-8 week mark from surgery when the stitches at the top of the vagina begin to dissolve.  The spotting is normal but if you experience heavy bleeding, call our office.  8. Plan to come to the office next week on December 6 for foley catheter removal.  Call for decreased urine output, blood in urine, foley issues.  Diet: 1. Low sodium Heart Healthy Diet is recommended.  2. It is safe to use a laxative, such as Miralax or Colace, if you have difficulty moving your bowels.   Wound Care: 1. Keep clean and dry.  Shower daily.  Reasons to call the Doctor:  Fever - Oral temperature greater than 100.4 degrees Fahrenheit  Foul-smelling vaginal discharge  Difficulty urinating  Nausea and vomiting  Increased pain at the site of the incision that is unrelieved with pain medicine.  Difficulty breathing with or without chest pain  New calf pain especially if only on one side  Sudden, continuing increased vaginal bleeding with or without clots.   Contacts: For questions or concerns you should contact:  Dr. Everitt Amber at [place], NP at  [phone]  After Hours: call [phone] and have the GYN Oncologist paged/contacted   Foley Catheter Care, Adult A Foley catheter is a soft, flexible tube. This tube is placed into your bladder to drain pee (urine). If you go home with this catheter in place, follow the instructions below. TAKING CARE OF THE CATHETER 1. Wash your hands with soap and water. 2. Put soap and water on a clean washcloth.  Clean the skin where the tube goes into your body.  Clean away from the tube site.  Never wipe toward the tube.  Clean the area using a circular motion.  Remove all the soap. Pat the area dry with a clean towel. For males, reposition the skin that covers the end of the penis (foreskin). 3. Attach the tube to your leg with tape or a leg strap. Do not stretch the tube tight. If you are using tape, remove any stickiness left behind by past tape you used. 4. Keep the drainage bag below your hips. Keep it off the floor. 5. Check your tube during the day. Make sure it is working and draining. Make sure the tube does not curl, twist, or bend. 6. Do not pull on the tube or try to take it out. TAKING CARE OF THE DRAINAGE BAGS You will have a large overnight drainage bag and a small leg bag. You may wear the  overnight bag any time. Never wear the small bag at night. Follow the directions below. Emptying the Drainage Bag  Empty your drainage bag when it is ?- full or at least 2-3 times a day. 1. Wash your hands with soap and water. 2. Keep the drainage bag below your hips. 3. Hold the dirty bag over the toilet or clean container. 4. Open the pour spout at the bottom of the bag. Empty the pee into the toilet or container. Do not let the pour spout touch anything. 5. Clean the pour spout with a gauze pad or cotton ball that has rubbing alcohol on it. 6. Close the pour spout. 7. Attach the bag to your leg with tape or a leg strap. 8. Wash your hands well. Changing the Drainage Bag  Change  your bag once a month or sooner if it starts to smell or look dirty.  1. Wash your hands with soap and water. 2. Pinch the rubber tube so that pee does not spill out. 3. Disconnect the catheter tube from the drainage tube at the connection valve. Do not let the tubes touch anything. 4. Clean the end of the catheter tube with an alcohol wipe. Clean the end of a the drainage tube with a different alcohol wipe. 5. Connect the catheter tube to the drainage tube of the clean drainage bag. 6. Attach the new bag to the leg with tape or a leg strap. Avoid attaching the new bag too tightly. 7. Wash your hands well. Cleaning the Drainage Bag  1. Wash your hands with soap and water. 2. Wash the bag in warm, soapy water. 3. Rinse the bag with warm water. 4. Fill the bag with a mixture of white vinegar and water (1 cup vinegar to 1 quart warm water [.2 liter vinegar to 1 liter warm water]). Close the bag and soak it for 30 minutes in the solution. 5. Rinse the bag with warm water. 6. Hang the bag to dry with the pour spout open and hanging downward. 7. Store the clean bag (once it is dry) in a clean plastic bag. 8. Wash your hands well. PREVENT INFECTION  Wash your hands before and after touching your tube.  Take showers every day. Wash the skin where the tube enters your body. Do not take baths. Replace wet leg straps with dry ones, if this applies.  Do not use powders, sprays, or lotions on the genital area. Only use creams, lotions, or ointments as told by your doctor.  For females, wipe from front to back after going to the bathroom.  Drink enough fluids to keep your pee clear or pale yellow unless you are told not to have too much fluid (fluid restriction).  Do not let the drainage bag or tubing touch or lie on the floor.  Wear cotton underwear to keep the area dry. GET HELP IF:  Your pee is cloudy or smells unusually bad.  Your tube becomes clogged.  You are not draining pee into the  bag or your bladder feels full.  Your tube starts to leak. GET HELP RIGHT AWAY IF:  You have pain, puffiness (swelling), redness, or yellowish-white fluid (pus) where the tube enters the body.  You have pain in the belly (abdomen), legs, lower back, or bladder.  You have a fever.  You see blood fill the tube, or your pee is pink or red.  You feel sick to your stomach (nauseous), throw up (vomit), or have chills.  Your tube  gets pulled out. MAKE SURE YOU:   Understand these instructions.  Will watch your condition.  Will get help right away if you are not doing well or get worse. This information is not intended to replace advice given to you by your health care provider. Make sure you discuss any questions you have with your health care provider. Document Released: 08/03/2012 Document Revised: 04/29/2014 Document Reviewed: 03/25/2015 Elsevier Interactive Patient Education  2017 [employer].

## 2016-03-21 ENCOUNTER — Ambulatory Visit (HOSPITAL_BASED_OUTPATIENT_CLINIC_OR_DEPARTMENT_OTHER): Payer: BLUE CROSS/BLUE SHIELD

## 2016-03-21 ENCOUNTER — Ambulatory Visit (HOSPITAL_COMMUNITY)
Admission: RE | Admit: 2016-03-21 | Discharge: 2016-03-21 | Disposition: A | Discharge status: Home / Self Care | Payer: BLUE CROSS/BLUE SHIELD | Source: Ambulatory Visit | Attending: Gynecologic Oncology | Admitting: Gynecologic Oncology

## 2016-03-21 ENCOUNTER — Ambulatory Visit (HOSPITAL_BASED_OUTPATIENT_CLINIC_OR_DEPARTMENT_OTHER): Payer: BLUE CROSS/BLUE SHIELD | Admitting: Gynecologic Oncology

## 2016-03-21 ENCOUNTER — Inpatient Hospital Stay (HOSPITAL_COMMUNITY)
Admission: AD | Admit: 2016-03-21 | Discharge: 2016-03-24 | DRG: 358 | Disposition: A | Discharge status: Home / Self Care | Payer: BLUE CROSS/BLUE SHIELD | Source: Ambulatory Visit | Attending: Obstetrics & Gynecology | Admitting: Obstetrics & Gynecology

## 2016-03-21 ENCOUNTER — Telehealth: Payer: Self-pay | Admitting: *Deleted

## 2016-03-21 ENCOUNTER — Other Ambulatory Visit: Payer: Self-pay | Admitting: Gynecologic Oncology

## 2016-03-21 ENCOUNTER — Encounter (HOSPITAL_COMMUNITY): Payer: Self-pay

## 2016-03-21 ENCOUNTER — Other Ambulatory Visit: Payer: Self-pay | Admitting: *Deleted

## 2016-03-21 ENCOUNTER — Inpatient Hospital Stay (HOSPITAL_COMMUNITY): Payer: BLUE CROSS/BLUE SHIELD

## 2016-03-21 VITALS — BP 153/76 | HR 83 | Temp 98.4°F | Resp 18

## 2016-03-21 DIAGNOSIS — K567 Ileus, unspecified: Secondary | ICD-10-CM | POA: Diagnosis present

## 2016-03-21 DIAGNOSIS — K5981 Ogilvie syndrome: Secondary | ICD-10-CM

## 2016-03-21 DIAGNOSIS — K9189 Other postprocedural complications and disorders of digestive system: Secondary | ICD-10-CM

## 2016-03-21 DIAGNOSIS — C539 Malignant neoplasm of cervix uteri, unspecified: Secondary | ICD-10-CM

## 2016-03-21 DIAGNOSIS — Z9889 Other specified postprocedural states: Principal | ICD-10-CM

## 2016-03-21 DIAGNOSIS — Z8249 Family history of ischemic heart disease and other diseases of the circulatory system: Secondary | ICD-10-CM

## 2016-03-21 DIAGNOSIS — K598 Other specified functional intestinal disorders: Secondary | ICD-10-CM

## 2016-03-21 DIAGNOSIS — R112 Nausea with vomiting, unspecified: Secondary | ICD-10-CM

## 2016-03-21 DIAGNOSIS — C531 Malignant neoplasm of exocervix: Secondary | ICD-10-CM

## 2016-03-21 DIAGNOSIS — I890 Lymphedema, not elsewhere classified: Secondary | ICD-10-CM | POA: Diagnosis present

## 2016-03-21 DIAGNOSIS — R1110 Vomiting, unspecified: Secondary | ICD-10-CM | POA: Insufficient documentation

## 2016-03-21 DIAGNOSIS — G43A0 Cyclical vomiting, not intractable: Secondary | ICD-10-CM

## 2016-03-21 DIAGNOSIS — C530 Malignant neoplasm of endocervix: Secondary | ICD-10-CM | POA: Diagnosis present

## 2016-03-21 DIAGNOSIS — Z4659 Encounter for fitting and adjustment of other gastrointestinal appliance and device: Secondary | ICD-10-CM

## 2016-03-21 LAB — CBC WITH DIFFERENTIAL/PLATELET
BASO%: 0.2 % (ref 0.0–2.0)
Basophils Absolute: 0 10*3/uL (ref 0.0–0.1)
EOS ABS: 0.1 10*3/uL (ref 0.0–0.5)
EOS%: 1.1 % (ref 0.0–7.0)
HEMATOCRIT: 38 % (ref 34.8–46.6)
HGB: 12.3 g/dL (ref 11.6–15.9)
LYMPH#: 1.4 10*3/uL (ref 0.9–3.3)
LYMPH%: 14.6 % (ref 14.0–49.7)
MCH: 30.1 pg (ref 25.1–34.0)
MCHC: 32.4 g/dL (ref 31.5–36.0)
MCV: 92.9 fL (ref 79.5–101.0)
MONO#: 0.7 10*3/uL (ref 0.1–0.9)
MONO%: 6.8 % (ref 0.0–14.0)
NEUT#: 7.4 10*3/uL — ABNORMAL HIGH (ref 1.5–6.5)
NEUT%: 77.3 % — AB (ref 38.4–76.8)
PLATELETS: 184 10*3/uL (ref 145–400)
RBC: 4.09 10*6/uL (ref 3.70–5.45)
RDW: 12.3 % (ref 11.2–14.5)
WBC: 9.6 10*3/uL (ref 3.9–10.3)

## 2016-03-21 LAB — COMPREHENSIVE METABOLIC PANEL
ALT: 15 U/L (ref 0–55)
ANION GAP: 7 meq/L (ref 3–11)
AST: 17 U/L (ref 5–34)
Albumin: 3.7 g/dL (ref 3.5–5.0)
Alkaline Phosphatase: 81 U/L (ref 40–150)
BILIRUBIN TOTAL: 0.63 mg/dL (ref 0.20–1.20)
BUN: 10 mg/dL (ref 7.0–26.0)
CO2: 28 meq/L (ref 22–29)
CREATININE: 0.7 mg/dL (ref 0.6–1.1)
Calcium: 9.6 mg/dL (ref 8.4–10.4)
Chloride: 103 mEq/L (ref 98–109)
EGFR: >90 mL/min/{1.73_m2} (ref >90)
GLUCOSE: 106 mg/dL (ref 70–140)
Potassium: 4.4 mEq/L (ref 3.5–5.1)
SODIUM: 139 meq/L (ref 136–145)
TOTAL PROTEIN: 7.4 g/dL (ref 6.4–8.3)

## 2016-03-21 LAB — CBC
HCT: 36.4 % (ref 36.0–46.0)
HEMOGLOBIN: 12 g/dL (ref 12.0–15.0)
MCH: 30.7 pg (ref 26.0–34.0)
MCHC: 33 g/dL (ref 30.0–36.0)
MCV: 93.1 fL (ref 78.0–100.0)
PLATELETS: 173 10*3/uL (ref 150–400)
RBC: 3.91 MIL/uL (ref 3.87–5.11)
RDW: 12.4 % (ref 11.5–15.5)
WBC: 8.1 10*3/uL (ref 4.0–10.5)

## 2016-03-21 LAB — PROTIME-INR
INR: 1 — ABNORMAL LOW (ref 2.00–3.50)
Protime: 12 Seconds (ref 10.6–13.4)

## 2016-03-21 MED ORDER — ENOXAPARIN SODIUM 40 MG/0.4ML ~~LOC~~ SOLN
40.0000 mg | SUBCUTANEOUS | Status: DC
  Administered 2016-03-22 – 2016-03-23 (×2): 40 mg via SUBCUTANEOUS
  Filled 2016-03-21 (×2): qty 0.4

## 2016-03-21 MED ORDER — HYDROMORPHONE HCL 1 MG/ML IJ SOLN
0.5000 mg | INTRAMUSCULAR | Status: DC | PRN
  Administered 2016-03-21 – 2016-03-23 (×2): 0.5 mg via INTRAVENOUS
  Filled 2016-03-21 (×2): qty 1

## 2016-03-21 MED ORDER — ONDANSETRON HCL 4 MG/2ML IJ SOLN: 4.0000 mg | Freq: Four times a day (QID) | INTRAMUSCULAR | Status: DC | PRN

## 2016-03-21 MED ORDER — DEXTROSE-NACL 5-0.45 % IV SOLN
INTRAVENOUS | Status: DC
  Administered 2016-03-21: 1000 mL via INTRAVENOUS
  Administered 2016-03-21: 15:00:00 via INTRAVENOUS

## 2016-03-21 MED ORDER — ONDANSETRON HCL 4 MG PO TABS: 4.0000 mg | ORAL_TABLET | Freq: Four times a day (QID) | ORAL | Status: DC | PRN

## 2016-03-21 NOTE — Telephone Encounter (Signed)
Notified pt husband to take Pt to Radiology for chest xray, then the cancer center - pt will be evaluated and worked into schedule.Caitlin Flowers verbalized understanding no further concerns.

## 2016-03-21 NOTE — Progress Notes (Signed)
GYN ONCOLOGY OFFICE VISIT    Caitlin Flowers 45 y.o. female  CC:  Postoperative nausea vomiting abdominal pain.   Assessment/Plan:  Caitlin Flowers  is a 45 y.o.  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 PET negative for gross extra-cervical disease. Status post robotic-assisted laparoscopic radical hysterectomy salpingectomy, and SLN biopsy with lymphadenectomy 2 days ago and now presents with emesis and imaging c/w colonic ileus.   Per discussion with Radiology team the cecum is 6.4 cm and the transverse colon is 9.7 cm.   Caitlin Flowers is very uncomfortable with spasms of abdominal pain primarily in the left lower quadrant.  There is diffuse abdominal tenderness and hypoactive bowel sounds. On imaging the cecum is not markedly distended, there is no evidence of free air on the upright abdominal imaging there is however considerable air in the transverse colon. Admit for NGT suction and placement of rectal tube to assist with decompression,  Repeat abd series every 12 hours CMP CBC pending.  We will assess for electrolyte abnormalities and the correct as is appropriate. We'll begin IV fluids D5 half-normal saline at 100 mL/h  Bilateral lower extremity lymphedema She has pre-existing unexplained left lower extremity lymphedema. No clear etiology based on imaging.    HPI: Caitlin Flowers (speaks Amharic) with high grade SCC of the cervix. At the time of colposcopy September 2017 a visible lesion was seen on the anterior lip of the cervix. It was biopsied and returned as high grade squamous cell carcinoma. 02/07/16 PET/CT was performed and showed no evidence of extracervical spread (including no lymphadenopathy) or explanation for LE lymphedema.     Interval history:  The patient underwent laparoscopic robotic-assisted laparoscopic radical hysterectomy bilateral salpingectomy and sentinel lymph node biopsy with  lymphadenectomy on 03/19/2016.  Today she presents with complaints of 5 episodes of emesis over the past 24 hours at the inability to tolerate any by mouth fluids.  Reports continuous dry heaving and nausea since discharge  Has tried to take the narcotic medicine Rx.  Reports bleeding at umbilical port site.  Reports spasmodic pain primarily in the left lower quadrant Denies fever or chills, denies vaginal bleeding. Reports flatus up to yesterday   Current Meds:  Outpatient Encounter Prescriptions as of 03/21/2016  Medication Sig  . ibuprofen (ADVIL,MOTRIN) 800 MG tablet Take 1 tablet (800 mg total) by mouth every 8 (eight) hours as needed (mild pain).  Marland Kitchen oxyCODONE-acetaminophen (PERCOCET/ROXICET) 5-325 MG tablet Take 1-2 tablets by mouth every 4 (four) hours as needed (moderate to severe pain (when tolerating fluids)).  Marland Kitchen senna (SENOKOT) 8.6 MG TABS tablet Take 1 tablet (8.6 mg total) by mouth at bedtime. Use while taking percocet or if constipated   No facility-administered encounter medications on file as of 03/21/2016.     Allergy:  Allergies  Allergen Reactions  . Influenza Vaccines Shortness Of Breath    Flu like symptoms     Social Hx:   Social History   Social History  . Marital status: Legally Separated    Spouse name: N/A  . Number of children: N/A  . Years of education: N/A   Occupational History  . Not on file.   Social History Main Topics  . Smoking status: Never Smoker  . Smokeless tobacco: Never Used  . Alcohol use No  . Drug use: No  . Sexual activity: Yes    Birth control/ protection: None   Other  Topics Concern  . Not on file   Social History Narrative  . No narrative on file    Past Surgical Hx:  Past Surgical History:  Procedure Laterality Date  . DILATION AND CURETTAGE OF UTERUS  02/11/12   Hysteroscopy with versapoint resection/myomectomy/  . INCISION AND DRAINAGE OF WOUND Right 01/13/2013   Procedure: IRRIGATION AND DEBRIDEMENT OF NAIL BED  ABLATION AND FULL THICKNESS SKIN GRAFT ;  Surgeon: Schuyler Amor, MD;  Location: [place];  Service: Orthopedics;  Laterality: Right;  . ROBOTIC ASSISTED LAP VAGINAL HYSTERECTOMY N/A 03/19/2016   Procedure: XI ROBOTIC ASSISTED LAPAROSCOPIC RADICAL HYSTERECTOMY;  Surgeon: Everitt Amber, MD;  Location: [facility];  Service: Gynecology;  Laterality: N/A;  . ROBOTIC ASSISTED SALPINGO OOPHERECTOMY Bilateral 03/19/2016   Procedure: XI ROBOTIC ASSISTED SALPINGO OOPHORECTOMY;  Surgeon: Everitt Amber, MD;  Location: [facility];  Service: Gynecology;  Laterality: Bilateral;  . SENTINEL NODE BIOPSY Bilateral 03/19/2016   Procedure: PELVIC LYMPH NODE BIOPSY;  Surgeon: Everitt Amber, MD;  Location: [facility];  Service: Gynecology;  Laterality: Bilateral;  . WISDOM TOOTH EXTRACTION      Past Medical Hx:  Past Medical History:  Diagnosis Date  . Anemia   . Fibroid (bleeding) (uterine) 2012  . SVD (spontaneous vaginal delivery)    x 2  . Wears glasses    to read    Past Gynecological History:  SVD x 2 Patient's last menstrual period was 02/06/2016.  Family Hx:  Family History  Problem Relation Age of Onset  . Hypertension Mother     Review of Systems: Constitutional  Feels poorly  ENT Normal appearing ears and nares bilaterally Cardiovascular  No chest pain, shortness of breath, or edema  Pulmonary  No cough or wheeze.  Gastro Intestinal  Nausea and vomiting since discharge.  No bright red blood per rectum, crampy intermittent  abdominal pain, flatus yesterday Genito Urinary  No frequency, urgency, dysuria, no bleeding Musculo Skeletal  + LLE edema 3+ Psychology  No depression, anxiety, insomnia.   Vitals:  Last menstrual period 02/06/2016.  Physical Exam: BP (!) 153/76 (BP Location: Left Arm, Patient Position: Sitting)   Pulse 83   Temp 98.4 F (36.9 C) (Oral)   Resp 18   LMP 02/06/2016   SpO2 100%  WD in significant distress Neck  Supple NROM, without any enlargements.   Cardiovascular  Pulse tachycardic, RR.  Lungs  Clear to auscultation bilateraly, without wheezes/crackles/rhonchi. Good air movement.  Skin  No rash/lesions/breakdown  Psychiatry  Alert and oriented to person, place, and time  Abdomen  Normoactive bowel sounds, abdomen soft, distended,diffusely tender hematoma at the umbilical port removed.  No active bleeding. Back No CVA tenderness Genito Urinary  Vulva/vagina: Normal external female genitalia.  No lesions. No discharge or bleeding.  Cuff intact. No palpable masses, exam limited by patient discomfort. Rectal  Good tone, no masses very uncomfortable exam that was limited.   Extremities  3+ BLE edema

## 2016-03-21 NOTE — Progress Notes (Addendum)
Nursing Note: Pt has dark, coffee ground colored drainage from Ng tube approx 450 cc since 6p.A: Paged Dr.Rossi and made aware.Will order flexo-ceal for rectal tube per Dr.Rossi.wbb

## 2016-03-21 NOTE — Progress Notes (Signed)
Patient refusing rectal tube placement ,MD notified, Dr Denman George was at patient's bedside to explain the need for it. Will continue to monitor.

## 2016-03-21 NOTE — H&P (Signed)
Caitlin Flowers 45 y.o. female  CC:  Postoperative nausea vomiting abdominal pain.   Assessment/Plan:  Caitlin Flowers  is a 45 y.o.  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 PET negative for gross extra-cervical disease. Status post robotic-assisted laparoscopic radical hysterectomy salpingectomy, and SLN biopsy with lymphadenectomy 2 days ago and now presents with emesis and imaging c/w colonic ileus.   Per discussion with Radiology team the cecum is 6.4 cm and the transverse colon is 9.7 cm.   Caitlin Flowers is very uncomfortable with spasms of abdominal pain primarily in the left lower quadrant.  There is diffuse abdominal tenderness and hypoactive bowel sounds. On imaging the cecum is not markedly distended, there is no evidence of free air on the upright abdominal imaging there is however considerable air in the transverse colon. Admit for NGT suction and placement of rectal tube to assist with decompression,  Repeat abd series every 12 hours CMP CBC pending.  We will assess for electrolyte abnormalities and the correct as is appropriate. We'll begin IV fluids D5 half-normal saline at 100 mL/h  Bilateral lower extremity lymphedema She has pre-existing unexplained left lower extremity lymphedema. No clear etiology based on imaging.    HPI: Caitlin Flowers is a 45 year old [place] woman (speaks Amharic) with high grade SCC of the cervix. At the time of colposcopy September 2017 a visible lesion was seen on the anterior lip of the cervix. It was biopsied and returned as high grade squamous cell carcinoma. 02/07/16 PET/CT was performed and showed no evidence of extracervical spread (including no lymphadenopathy) or explanation for LE lymphedema.     Interval history:  The patient underwent laparoscopic robotic-assisted laparoscopic radical hysterectomy bilateral salpingectomy and sentinel lymph node biopsy with lymphadenectomy on 03/19/2016.   Today she presents with complaints of 5 episodes of emesis over the past 24 hours at the inability to tolerate any by mouth fluids.  Reports continuous dry heaving and nausea since discharge  Has tried to take the narcotic medicine Rx.  Reports bleeding at umbilical port site.  Reports spasmodic pain primarily in the left lower quadrant Denies fever or chills, denies vaginal bleeding. Reports flatus up to yesterday   Current Meds:      Outpatient Encounter Prescriptions as of 03/21/2016  Medication Sig  . ibuprofen (ADVIL,MOTRIN) 800 MG tablet Take 1 tablet (800 mg total) by mouth every 8 (eight) hours as needed (mild pain).  Marland Kitchen oxyCODONE-acetaminophen (PERCOCET/ROXICET) 5-325 MG tablet Take 1-2 tablets by mouth every 4 (four) hours as needed (moderate to severe pain (when tolerating fluids)).  Marland Kitchen senna (SENOKOT) 8.6 MG TABS tablet Take 1 tablet (8.6 mg total) by mouth at bedtime. Use while taking percocet or if constipated   No facility-administered encounter medications on file as of 03/21/2016.     Allergy:       Allergies  Allergen Reactions  . Influenza Vaccines Shortness Of Breath    Flu like symptoms     Social Hx:   Social History        Social History  . Marital status: Legally Separated    Spouse name: N/A  . Number of children: N/A  . Years of education: N/A      Occupational History  . Not on file.        Social History Main Topics  . Smoking status: Never Smoker  . Smokeless tobacco: Never Used  . Alcohol use No  . Drug use: No  .  Sexual activity: Yes    Birth control/ protection: None       Other Topics Concern  . Not on file      Social History Narrative  . No narrative on file    Past Surgical Hx:       Past Surgical History:  Procedure Laterality Date  . DILATION AND CURETTAGE OF UTERUS  02/11/12   Hysteroscopy with versapoint resection/myomectomy/  . INCISION AND DRAINAGE OF WOUND Right 01/13/2013   Procedure:  IRRIGATION AND DEBRIDEMENT OF NAIL BED ABLATION AND FULL THICKNESS SKIN GRAFT ;  Surgeon: Schuyler Amor, MD;  Location: [place];  Service: Orthopedics;  Laterality: Right;  . ROBOTIC ASSISTED LAP VAGINAL HYSTERECTOMY N/A 03/19/2016   Procedure: XI ROBOTIC ASSISTED LAPAROSCOPIC RADICAL HYSTERECTOMY;  Surgeon: Everitt Amber, MD;  Location: [facility];  Service: Gynecology;  Laterality: N/A;  . ROBOTIC ASSISTED SALPINGO OOPHERECTOMY Bilateral 03/19/2016   Procedure: XI ROBOTIC ASSISTED SALPINGO OOPHORECTOMY;  Surgeon: Everitt Amber, MD;  Location: [facility];  Service: Gynecology;  Laterality: Bilateral;  . SENTINEL NODE BIOPSY Bilateral 03/19/2016   Procedure: PELVIC LYMPH NODE BIOPSY;  Surgeon: Everitt Amber, MD;  Location: [facility];  Service: Gynecology;  Laterality: Bilateral;  . WISDOM TOOTH EXTRACTION      Past Medical Hx:      Past Medical History:  Diagnosis Date  . Anemia   . Fibroid (bleeding) (uterine) 2012  . SVD (spontaneous vaginal delivery)    x 2  . Wears glasses    to read    Past Gynecological History:  SVD x 2 Patient's last menstrual period was 02/06/2016.  Family Hx:       Family History  Problem Relation Age of Onset  . Hypertension Mother     Review of Systems: Constitutional  Feels poorly  ENT Normal appearing ears and nares bilaterally Cardiovascular  No chest pain, shortness of breath, or edema  Pulmonary  No cough or wheeze.  Gastro Intestinal  Nausea and vomiting since discharge.  No bright red blood per rectum, crampy intermittent  abdominal pain, flatus yesterday Genito Urinary  No frequency, urgency, dysuria, no bleeding Musculo Skeletal  + LLE edema 3+ Psychology  No depression, anxiety, insomnia.   Vitals:  Last menstrual period 02/06/2016.  Physical Exam: BP (!) 153/76 (BP Location: Left Arm, Patient Position: Sitting)   Pulse 83   Temp 98.4 F (36.9 C) (Oral)   Resp 18   LMP 02/06/2016   SpO2 100%  WD  in significant distress Neck  Supple NROM, without any enlargements.  Cardiovascular  Pulse tachycardic, RR.  Lungs  Clear to auscultation bilateraly, without wheezes/crackles/rhonchi. Good air movement.  Skin  No rash/lesions/breakdown  Psychiatry  Alert and oriented to person, place, and time  Abdomen  Normoactive bowel sounds, abdomen soft, distended,diffusely tender hematoma at the umbilical port removed.  No active bleeding. Back No CVA tenderness Genito Urinary  Vulva/vagina: Normal external female genitalia.  No lesions. No discharge or bleeding.             Cuff intact. No palpable masses, exam limited by patient discomfort. Rectal  Good tone, no masses very uncomfortable exam that was limited.   Extremities  3+ BLE edema

## 2016-03-22 ENCOUNTER — Inpatient Hospital Stay (HOSPITAL_COMMUNITY): Payer: BLUE CROSS/BLUE SHIELD

## 2016-03-22 LAB — COMPREHENSIVE METABOLIC PANEL
ALBUMIN: 3.8 g/dL (ref 3.5–5.0)
ALK PHOS: 61 U/L (ref 38–126)
ALT: 15 U/L (ref 14–54)
ALT: 15 U/L (ref 14–54)
ANION GAP: 7 (ref 5–15)
ANION GAP: 10 (ref 5–15)
AST: 18 U/L (ref 15–41)
AST: 15 U/L (ref 15–41)
Albumin: 3.7 g/dL (ref 3.5–5.0)
Alkaline Phosphatase: 58 U/L (ref 38–126)
BILIRUBIN TOTAL: 0.6 mg/dL (ref 0.3–1.2)
BUN: 10 mg/dL (ref 6–20)
BUN: 9 mg/dL (ref 6–20)
CALCIUM: 9.1 mg/dL (ref 8.9–10.3)
CHLORIDE: 102 mmol/L (ref 101–111)
CO2: 30 mmol/L (ref 22–32)
CO2: 25 mmol/L (ref 22–32)
Calcium: 8.8 mg/dL — ABNORMAL LOW (ref 8.9–10.3)
Chloride: 102 mmol/L (ref 101–111)
Creatinine, Ser: 0.54 mg/dL (ref 0.44–1.00)
Creatinine, Ser: 0.47 mg/dL (ref 0.44–1.00)
GFR calc Af Amer: >60 mL/min (ref >60)
GFR calc non Af Amer: >60 mL/min (ref >60)
GLUCOSE: 113 mg/dL — AB (ref 65–99)
Glucose, Bld: 114 mg/dL — ABNORMAL HIGH (ref 65–99)
POTASSIUM: 3.7 mmol/L (ref 3.5–5.1)
Potassium: 3.7 mmol/L (ref 3.5–5.1)
SODIUM: 139 mmol/L (ref 135–145)
Sodium: 137 mmol/L (ref 135–145)
TOTAL PROTEIN: 6.7 g/dL (ref 6.5–8.1)
Total Bilirubin: 1 mg/dL (ref 0.3–1.2)
Total Protein: 6.8 g/dL (ref 6.5–8.1)

## 2016-03-22 LAB — CBC
HEMATOCRIT: 36.5 % (ref 36.0–46.0)
Hemoglobin: 12 g/dL (ref 12.0–15.0)
MCH: 30 pg (ref 26.0–34.0)
MCHC: 32.9 g/dL (ref 30.0–36.0)
MCV: 91.3 fL (ref 78.0–100.0)
PLATELETS: 164 10*3/uL (ref 150–400)
RBC: 4 MIL/uL (ref 3.87–5.11)
RDW: 12.1 % (ref 11.5–15.5)
WBC: 8 10*3/uL (ref 4.0–10.5)

## 2016-03-22 LAB — LACTIC ACID, PLASMA: LACTIC ACID, VENOUS: 0.7 mmol/L (ref 0.5–1.9)

## 2016-03-22 LAB — LACTATE DEHYDROGENASE: LDH: 156 U/L (ref 98–192)

## 2016-03-22 MED ORDER — ZOLPIDEM TARTRATE 5 MG PO TABS
5.0000 mg | ORAL_TABLET | Freq: Once | ORAL | Status: AC
  Administered 2016-03-22: 5 mg via ORAL

## 2016-03-22 MED ORDER — PANTOPRAZOLE SODIUM 40 MG IV SOLR
40.0000 mg | INTRAVENOUS | Status: DC
  Administered 2016-03-22 – 2016-03-24 (×3): 40 mg via INTRAVENOUS
  Filled 2016-03-22 (×3): qty 40

## 2016-03-22 MED ORDER — ZOLPIDEM TARTRATE 5 MG PO TABS
ORAL_TABLET | ORAL | Status: AC
  Filled 2016-03-22: qty 1

## 2016-03-22 MED ORDER — KCL IN DEXTROSE-NACL 20-5-0.45 MEQ/L-%-% IV SOLN
INTRAVENOUS | Status: DC
  Administered 2016-03-22 – 2016-03-24 (×5): via INTRAVENOUS
  Filled 2016-03-22 (×6): qty 1000

## 2016-03-22 MED ORDER — LORAZEPAM 2 MG/ML IJ SOLN: 0.5000 mg | Freq: Once | INTRAMUSCULAR | Status: DC

## 2016-03-22 MED ORDER — ZOLPIDEM TARTRATE 5 MG PO TABS: 5.0000 mg | ORAL_TABLET | Freq: Once | ORAL | Status: DC

## 2016-03-22 NOTE — Progress Notes (Signed)
Nursing Note: spoke w/ radiologist about result of abd film in relation to NG tube and was instructed to advance tube 2 cm.Able to advance tube  Very little with much difficulty as pt began to gag and wretch and took some time.Pt tolerated fair.wbb

## 2016-03-22 NOTE — Progress Notes (Signed)
Patient currently admitted for colonic ileus post-operatively from robotic assisted radical hysterectomy, bilateral salpingectomy, bilateral pelvic lymphadenectomy on 03/19/16 with Dr. Everitt Amber.  She presented to the office yesterday after moderate emesis post-op and inability to tolerate PO.  Abd xray was ordered resulting suspected colonic ileus.  She was admitted for NG tube/rectal tube decompression, IV fluids, and ileus management.    Subjective: Patient reports discomfort from the NG tube.  Family at the bedside.  They state she says her rectum is too sensitive to have the tube placed.  No pain reported except for discomfort from NG.  Reporting improvement in abdominal distention.  All questions answered.  Nurse at the bedside as well. Denies chest pain, dyspnea, passing flatus, or having a bowel movement.  Patient's family is reporting slight increase in facial edema.     Objective: Vital signs in last 24 hours: Temp:  [97.4 F (36.3 C)-99 F (37.2 C)] 98.4 F (36.9 C) (12/01 0510) Pulse Rate:  [83-98] 89 (12/01 0510) Resp:  [18] 18 (12/01 0510) BP: (138-153)/(76-92) 152/92 (12/01 0510) SpO2:  [97 %-100 %] 97 % (12/01 0510)    Intake/Output from previous day: 11/30 0701 - 12/01 0700 In: 1200 [I.V.:1200] Out: 2800 [Urine:1700; Emesis/NG output:1100]  Physical Examination: General: cooperative, no distress and appears slightly lethargic, opening eyes to stimuli and answering questions appropriately Resp: clear to auscultation bilaterally Cardio: regular rate and rhythm, S1, S2 normal, no murmur, click, rub or gallop GI: incision: lap sites healing, no drainage from the incision at the umbilicus and abdomen distention improved from yesterday's assessment, hypoactive bowel sounds, mildly tender on palpation Extremities: extremities normal, atraumatic, no cyanosis or edema Minimal facial edema noted.  Labs: WBC/Hgb/Hct/Plts:  8.0/12.0/36.5/164 (12/01 [id number])  BUN/Cr/glu/ALT/AST/amyl/lip:  9/0.47/--/15/15/--/-- (12/01 [id number])  Assessment: 45 y.o. s/p surgery on 03/19/16 (see above) currently admitted since 03/21/16 for colonic ileus post-op: stable Pain:  Pain is well-controlled on PRN medications.  Heme: Hgb 12.0 and Hct 36.5 this am.  Continues to remain stable.  CV: BP and HR stable.  Continue to monitor.  GI:  Tolerating po: No: NPO.   NG tube in place and advanced per radiologist recommendations per RN.  1100 cc from the NG reported at 6 am.  Rectal tube unable to be placed due to patient's tolerance level.  Protonix ordered.  Abd xray ordered for this am.  03/21/16 at 11:31 IMPRESSION: Suspect a degree of colonic ileus. No bowel obstruction evident. Note that there is stool and air in the rectum. No free air appreciable. There is atelectasis in the mid lung regions, slightly more on the right than on the left. Small calcified granuloma left base. Lungs elsewhere clear.   03/22/16 at 00:51 IMPRESSION: 1. Mild left basilar atelectasis 2. Esophageal tube tip overlies proximal stomach, side-port overlies distal esophagus 3. Slight decreased gaseous distention of colon. Overall gas pattern Nonobstructed.  GU: Foley in place.  Adequate output reported.      FEN: Stable.  Continue to monitor.  Prophylaxis: intermittent pneumatic compression boots and lovenox.  Plan: CBC and Cmet this am Change IVF to D51/2 NS with 20 KCL at 125 Abdomen xray 2 view this am Protonix IV ordered If bowel distention has improved, will discontinue order for rectal tube per Dr Denman George  If distention remains, plan to give Ativan IV and attempt rectal tube insertion Encourage ambulation, IS use, deep breathing, and coughing Continue plan of care per Dr. Delsa Sale and Dr. Everitt Amber   LOS: 1 day  Caitlin Flowers DEAL 03/22/2016, 11:28 AM

## 2016-03-22 NOTE — Progress Notes (Signed)
Nursing Note: Informed Dr.Rossi that flexoceal was unable to be placed.wbb

## 2016-03-22 NOTE — Progress Notes (Signed)
Nursing Note: Unable to insert flex-o- seal.Paged rapid response nurse Rapid response nurse was unable to insert tube. Pt tolerated fair.wbb

## 2016-03-22 NOTE — Progress Notes (Signed)
Nursing Note: Paged Merry Proud ,radilogist and made aware only able to advance tube 1 cm.No further recommendations.wbb

## 2016-03-22 NOTE — Progress Notes (Signed)
Abd 2 view reviewed.  Dr. Denman George made aware.  Discontinue order for rectal tube since xray showing improvement and patient unable to tolerate.  Plan to maintain the NG tube to suction until output is less than 500 cc/day and passing flatus.  RN notified of this and asked to inform family.

## 2016-03-23 LAB — BASIC METABOLIC PANEL
ANION GAP: 6 (ref 5–15)
BUN: 8 mg/dL (ref 6–20)
CALCIUM: 8.8 mg/dL — AB (ref 8.9–10.3)
CO2: 28 mmol/L (ref 22–32)
Chloride: 103 mmol/L (ref 101–111)
Creatinine, Ser: 0.55 mg/dL (ref 0.44–1.00)
GFR calc Af Amer: >60 mL/min (ref >60)
GLUCOSE: 120 mg/dL — AB (ref 65–99)
POTASSIUM: 3.9 mmol/L (ref 3.5–5.1)
SODIUM: 137 mmol/L (ref 135–145)

## 2016-03-23 LAB — CBC
HEMATOCRIT: 34.3 % — AB (ref 36.0–46.0)
HEMOGLOBIN: 11.5 g/dL — AB (ref 12.0–15.0)
MCH: 30.7 pg (ref 26.0–34.0)
MCHC: 33.5 g/dL (ref 30.0–36.0)
MCV: 91.5 fL (ref 78.0–100.0)
Platelets: 176 10*3/uL (ref 150–400)
RBC: 3.75 MIL/uL — ABNORMAL LOW (ref 3.87–5.11)
RDW: 12.1 % (ref 11.5–15.5)
WBC: 7.4 10*3/uL (ref 4.0–10.5)

## 2016-03-23 MED ORDER — OXYCODONE-ACETAMINOPHEN 5-325 MG PO TABS: 1.0000 | ORAL_TABLET | ORAL | Status: DC | PRN

## 2016-03-23 MED ORDER — IBUPROFEN 800 MG PO TABS
800.0000 mg | ORAL_TABLET | Freq: Three times a day (TID) | ORAL | Status: DC
Start: 2016-03-23 — End: 2016-03-24
  Administered 2016-03-23 – 2016-03-24 (×3): 800 mg via ORAL
  Filled 2016-03-23 (×3): qty 1

## 2016-03-23 MED ORDER — SENNA 8.6 MG PO TABS
1.0000 | ORAL_TABLET | Freq: Every day | ORAL | Status: DC
  Administered 2016-03-23: 8.6 mg via ORAL
  Filled 2016-03-23: qty 1

## 2016-03-23 NOTE — Progress Notes (Signed)
  Inpatient progress note:  Patient currently admitted for colonic ileus day 5 post-operatively from robotic assisted radical hysterectomy, bilateral salpingectomy, bilateral pelvic lymphadenectomy on 03/19/16 with Dr. Everitt Amber.   Hospital course:  She was admitted on 03/21/16 and imaging showed significant colonic distension with gas and stool in the rectum. NGT was placed. Rectal tube was attempted by not tolerated by the patient.  Serial xrays over 24 hours showed spontaneous resolution of the colonic ileus and more normalization of gas pattern. NGT output decreased to 200cc in 24 hours on hospital day 2. Patient began passing flatus on hospital day 2.  Subjective: On hospital day 3 (03/23/16) she reported no pain and endorsed flatus but no BM.   Objective: Vital signs in last 24 hours: Temp:  [97.5 F (36.4 C)-99 F (37.2 C)] 98.1 F (36.7 C) (12/02 0509) Pulse Rate:  [79-83] 81 (12/02 0509) Resp:  [16-20] 18 (12/02 0509) BP: (144-161)/(74-85) 144/74 (12/02 0509) SpO2:  [97 %-100 %] 97 % (12/02 0509) Weight:  [189 lb 9.5 oz (86 kg)] 189 lb 9.5 oz (86 kg) (12/02 0509) Last BM Date: 03/19/16  Intake/Output from previous day: 12/01 0701 - 12/02 0700 In: 2489 [I.V.:1989; NG/GT:500] Out: 2050 [Urine:1850; Emesis/NG output:200]  Physical Examination: General: cooperative, no distress and appears slightly lethargic, opening eyes to stimuli and answering questions appropriately Resp: clear to auscultation bilaterally Cardio: regular rate and rhythm, S1, S2 normal, no murmur, click, rub or gallop GI: incision: lap sites healing, no drainage from the incision at the umbilicus and decreased abdomen distention improved from yesterday's assessment, hypoactive bowel sounds, mildly tender on palpation Extremities: extremities normal, atraumatic, no cyanosis or edema Minimal facial edema noted.  Labs: WBC/Hgb/Hct/Plts:  7.4/11.5/34.3/176 (12/02 [id number]) BUN/Cr/glu/ALT/AST/amyl/lip:   8/0.55/--/--/--/--/-- (12/02 [id number])  Assessment: 45 y.o. s/p surgery on 03/19/16 (see above) currently admitted since 03/21/16 for colonic ileus post-op: stable, improving Pain:  Pain is well-controlled on PRN medications.  Heme: Hgb 12.0 and Hct 36.5 this am.  Continues to remain stable.  CV: BP and HR stable.  Continue to monitor.  GI:   NGT dc'd by me. Plan is to advance diet to full liquids. Will advance to solids tonight if she tolerates this and d.c to home tomorrow.   GU: Foley in place.   Foley in situ, and patient will be discharged with foley due to radical parametrial resection and risk for urinary retention. Will plan for foley removal in office 1 week postop. Adequate output reported.      FEN: Stable.  Continue to monitor.  Prophylaxis: intermittent pneumatic compression boots and lovenox.  Plan: D.c. NGT and advance diet to full liquids then solids. Encourage ambulation, IS use, deep breathing, and coughing Home tomorrow if tolerating PO. Oral pain meds.   LOS: 2 days    [name] 03/23/2016, 11:41 AM

## 2016-03-24 DIAGNOSIS — K567 Ileus, unspecified: Principal | ICD-10-CM

## 2016-03-24 DIAGNOSIS — K9189 Other postprocedural complications and disorders of digestive system: Secondary | ICD-10-CM

## 2016-03-24 NOTE — Discharge Instructions (Signed)
03/24/2016  Return to work: 6 weeks  Activity: 1. Be up and out of the bed during the day.  Take a nap if needed.  You may walk up steps but be careful and use the hand rail.  Stair climbing will tire you more than you think, you may need to stop part way and rest.   2. No lifting or straining for 6 weeks.  3. No driving for 1 weeks.  Do Not drive if you are taking narcotic pain medicine.  4. Shower daily.  Use soap and water on your incision and pat dry; don't rub.   5. No sexual activity and nothing in the vagina for 8 weeks.  Diet: 1. Low sodium Heart Healthy Diet is recommended.  2. It is safe to use a laxative if you have difficulty moving your bowels.   Wound Care: 1. Keep clean and dry.  Shower daily.  Reasons to call the Doctor:   Fever - Oral temperature greater than 100.4 degrees Fahrenheit  Foul-smelling vaginal discharge  Difficulty urinating  Nausea and vomiting  Increased pain at the site of the incision that is unrelieved with pain medicine.  Difficulty breathing with or without chest pain  New calf pain especially if only on one side  Sudden, continuing increased vaginal bleeding with or without clots.   Follow-up: 1. See Everitt Amber in 3 days (Wednesday 6th, December). Your catheter will be removed at that visit.  Contacts: For questions or concerns you should contact:  Dr. Everitt Amber at [phone]  or at [place]

## 2016-03-24 NOTE — Progress Notes (Signed)
Patient discharged to home with family, discharge instructions reviewed with patient and family who verbalized understanding. No new RX. Patient discharged with foley catheter from surgery.

## 2016-03-24 NOTE — Discharge Summary (Signed)
Physician Discharge Summary  Patient ID: Caitlin Flowers MRN: [id number] DOB/AGE: 44/01/1971 44 y.o.  Admit date: 03/21/2016 Discharge date: 03/24/2016  Admission Diagnoses: Ileus, postoperative [facility])  Discharge Diagnoses:  Principal Problem:   Ileus, postoperative [facility])   Discharged Condition: good  Hospital Course:  1/ patient admitted on 03/21/16 (POD 2 s/p robotic radical hysterectomy and pelvic lymphadenectomy for cervical cancer). 2/ developed colonic ileus (unexplained, idiopathic) associated with emesis 3/ abdominal films showed significant colonic distension with gas in rectum. 4/ NGT placed on admission, patient declined rectal tube. 5/ serial labs showed no infection and stable H&H 6/ serial abdominal films showed resolution of colonic distension and normalization of bowel gas pattern 7/ NGT d/c'd on 03/23/16 (HD 3) and diet advanced. Patient tolerated advancement of diet 8/ spontaneous bowel movements x 2 on HD 3 and HD 4. 9/ on 03/24/16 (HD 4) patient tolerating PO, passing flatus and BM, no pain. Meeting discharge criteria. 10/  Foley in situ, and patient will be discharged with foley due to radical parametrial resection and risk for urinary retention. Will plan for foley removal in office 1 week postop. 11/ discharged to home with foley in situ and follow-up on Wednesday December 6th.  Consults: None  Significant Diagnostic Studies: labs:  CBC    Component Value Date/Time   WBC 7.4 03/23/2016 0412   RBC 3.75 (L) 03/23/2016 0412   HGB 11.5 (L) 03/23/2016 0412   HGB 12.3 03/21/2016 1245   HCT 34.3 (L) 03/23/2016 0412   HCT 38.0 03/21/2016 1245   PLT 176 03/23/2016 0412   PLT 184 03/21/2016 1245   MCV 91.5 03/23/2016 0412   MCV 92.9 03/21/2016 1245   MCH 30.7 03/23/2016 0412   MCHC 33.5 03/23/2016 0412   RDW 12.1 03/23/2016 0412   RDW 12.3 03/21/2016 1245   LYMPHSABS 1.4 03/21/2016 1245   MONOABS 0.7 03/21/2016 1245   EOSABS 0.1 03/21/2016 1245   BASOSABS 0.0  03/21/2016 1245    and radiology: KUB: distended colon with gas on HD 1, resolution of this on HD 2 with placement of NGT  Treatments: IV hydration and NGT  Discharge Exam: Blood pressure 127/68, pulse 64, temperature 98 F (36.7 C), temperature source Oral, resp. rate 16, weight 182 lb 12.2 oz (82.9 kg), last menstrual period 02/06/2016, SpO2 100 %. General appearance: alert and cooperative Resp: clear to auscultation bilaterally Cardio: regular rate and rhythm, S1, S2 normal, no murmur, click, rub or gallop GI: soft, non-tender; bowel sounds normal; no masses,  no organomegaly Incision/Wound: clean and in tact  Disposition: 01-Home or Self Care  Discharge Instructions    (HEART FAILURE PATIENTS) Call MD:  Anytime you have any of the following symptoms: 1) 3 pound weight gain in 24 hours or 5 pounds in 1 week 2) shortness of breath, with or without a dry hacking cough 3) swelling in the hands, feet or stomach 4) if you have to sleep on extra pillows at night in order to breathe.    Complete by:  As directed    Call MD for:  difficulty breathing, headache or visual disturbances    Complete by:  As directed    Call MD for:  extreme fatigue    Complete by:  As directed    Call MD for:  hives    Complete by:  As directed    Call MD for:  persistant dizziness or light-headedness    Complete by:  As directed    Call MD for:  persistant  nausea and vomiting    Complete by:  As directed    Call MD for:  redness, tenderness, or signs of infection (pain, swelling, redness, odor or green/yellow discharge around incision site)    Complete by:  As directed    Call MD for:  severe uncontrolled pain    Complete by:  As directed    Call MD for:  temperature >100.4    Complete by:  As directed    Diet - low sodium heart healthy    Complete by:  As directed    Diet general    Complete by:  As directed    Driving Restrictions    Complete by:  As directed    No driving for 7 days or until off  narcotic pain medication   Increase activity slowly    Complete by:  As directed    Remove dressing in 24 hours    Complete by:  As directed    Sexual Activity Restrictions    Complete by:  As directed    No intercourse for 6 weeks       Medication List    TAKE these medications   ibuprofen 800 MG tablet Commonly known as:  ADVIL,MOTRIN Take 1 tablet (800 mg total) by mouth every 8 (eight) hours as needed (mild pain).   oxyCODONE-acetaminophen 5-325 MG tablet Commonly known as:  PERCOCET/ROXICET Take 1-2 tablets by mouth every 4 (four) hours as needed (moderate to severe pain (when tolerating fluids)).   senna 8.6 MG Tabs tablet Commonly known as:  SENOKOT Take 1 tablet (8.6 mg total) by mouth at bedtime. Use while taking percocet or if constipated        Signed: [name] 03/24/2016, 9:58 AM

## 2016-03-27 ENCOUNTER — Ambulatory Visit: Payer: BLUE CROSS/BLUE SHIELD | Attending: Gynecologic Oncology | Admitting: Gynecologic Oncology

## 2016-03-27 VITALS — BP 120/72 | HR 79 | Temp 98.4°F | Resp 18 | Ht 64.0 in | Wt 183.0 lb

## 2016-03-27 DIAGNOSIS — Z9889 Other specified postprocedural states: Secondary | ICD-10-CM | POA: Insufficient documentation

## 2016-03-27 DIAGNOSIS — C539 Malignant neoplasm of cervix uteri, unspecified: Secondary | ICD-10-CM

## 2016-03-27 DIAGNOSIS — Z8541 Personal history of malignant neoplasm of cervix uteri: Secondary | ICD-10-CM | POA: Diagnosis not present

## 2016-03-27 DIAGNOSIS — Z79899 Other long term (current) drug therapy: Secondary | ICD-10-CM | POA: Insufficient documentation

## 2016-03-27 DIAGNOSIS — Z8249 Family history of ischemic heart disease and other diseases of the circulatory system: Secondary | ICD-10-CM | POA: Insufficient documentation

## 2016-03-27 DIAGNOSIS — Z888 Allergy status to other drugs, medicaments and biological substances status: Secondary | ICD-10-CM | POA: Insufficient documentation

## 2016-03-27 DIAGNOSIS — Z466 Encounter for fitting and adjustment of urinary device: Secondary | ICD-10-CM

## 2016-03-27 NOTE — Patient Instructions (Signed)
Plan to follow up as scheduled.  If you get home and develop trouble urinating, please call our office at [phone] or after hours [phone].

## 2016-04-05 ENCOUNTER — Encounter: Payer: Self-pay | Admitting: Gynecologic Oncology

## 2016-04-05 NOTE — Progress Notes (Signed)
Follow Up Note: Gyn-Onc  Caitlin Flowers 45 y.o. female  CC:  Chief Complaint  Patient presents with  . Foley removal, post-op follow up    follow up    HPI:  Caitlin Flowers is a 45 year old female initially referred by Dr. Roselie Awkward for high grade SCC of the cervix.  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 She was recommended to return for colposcopy but failed to do so.  In August, 2017 she began noticing edema of her left lower extremity and sought evaluation in the ED which included a doppler of the lower extremity which was negative for DVT and a CT abdo/pelvis which showed no hydronephrosis or hydroureter, small uterine fibroids, but no lesions to explain her LE edema.  She followed up with Dr Roselie Awkward on 01/10/16 and proceeded with colposcopy. With colposcopy,  a visible lesion was seen on the anterior lip of the cervix. It was biopsied and returned as high grade squamous cell carcinoma. Endocervical curette revealed CIN III.  On 02/07/16, PET/CT was performed and showed no evidence of extracervical spread (including no lymphadenopathy) or explanation for LE lymphedema.  Surgery was originally scheduled for October, 2017, however, the week before surgery, the patient cancelled her surgery based on the information she was provided by her family members.  On 03/19/16, she underwent a robotic-assisted type III radical laparoscopic hysterectomy with bilateral salpingectomy and bilateral pelvic lymphadenectomy with Dr. Everitt Amber.  Post-operative course while inpatient was uncomplicated and she was discharged home with her foley catheter.  On POD 2, she was seen in the office after discharge for complaints of nausea, 5 or more episodes of emesis.  Imaging was performed which resulted a colonic ileus.  She was admitted for NG tube decompression, bowel rest, and IV hydration.  She was discharged home on 03/24/16 once bowel function returned.     Final path resulted: Diagnosis 1.  Lymph nodes, regional resection, right pelvic - FIVE BENIGN LYMPH NODES (0/5). 2. Lymph nodes, regional resection, left pelvic - SIX BENIGN LYMPH NODES (0/6). 3. Uterus and bilateral fallopian tubes, with cervix - UTERINE CERVIX: INVASIVE MODERATELY DIFFERENTIATED SQUAMOUS CELL CARCINOMA, 3.5 CM. - LYMPHOVASCULAR INVOLVEMENT BY TUMOR. - VAGINAL CUFF MARGIN FREE OF TUMOR. - INVASIVE CARCINOMA FOCALLY LESS THAN 0.1 CM FROM SURGICAL MARGIN. - UTERINE CORPUS AND BILATERAL FALLOPIAN TUBES FREE OF TUMOR. - ONE BENIGN PARAMETRIAL LYMPH NODE (0/1). 4. Vaginal resection, anterior vaginal margin - BENIGN SQUAMOUS MUCOSA. - NO DYSPLASIA OR MALIGNANCY.  Interval History:  She presents today with her family member and an interpreter for post-operative follow up, foley removal, and trial voiding.  She states she is doing much better since discharge from the hospital.  She is increasing her PO intake and having bowel movements/passing flatus.  No nausea or emesis reported.  Adequate urine reported from foley catheter.  Ambulating without difficulty.  No concerns voiced.    Review of Systems  Constitutional: Feels much better.  Denies fever, chills.  Decreased appetite improving slowly.    Cardiovascular: No chest pain, shortness of breath, or edema.  Pulmonary: No cough or wheeze.  Gastrointestinal: No nausea, vomiting, or diarrhea. No bright red blood per rectum or change in bowel movement.  Genitourinary: No frequency, urgency, or dysuria. No vaginal bleeding or discharge.  Musculoskeletal: No myalgia or joint pain. Neurologic: No weakness, numbness, or change in gait.  Psychology: No depression, anxiety, or insomnia.  Current Meds:  Outpatient Encounter Prescriptions as of  03/27/2016  Medication Sig  . ibuprofen (ADVIL,MOTRIN) 800 MG tablet Take 1 tablet (800 mg total) by mouth every 8 (eight) hours as needed (mild pain).  Marland Kitchen oxyCODONE-acetaminophen (PERCOCET/ROXICET) 5-325 MG tablet Take 1-2  tablets by mouth every 4 (four) hours as needed (moderate to severe pain (when tolerating fluids)).  Marland Kitchen senna (SENOKOT) 8.6 MG TABS tablet Take 1 tablet (8.6 mg total) by mouth at bedtime. Use while taking percocet or if constipated   No facility-administered encounter medications on file as of 03/27/2016.     Allergy:  Allergies  Allergen Reactions  . Influenza Vaccines Shortness Of Breath    Flu like symptoms     Social Hx:   Social History   Social History  . Marital status: Legally Separated    Spouse name: N/A  . Number of children: N/A  . Years of education: N/A   Occupational History  . Not on file.   Social History Main Topics  . Smoking status: Never Smoker  . Smokeless tobacco: Never Used  . Alcohol use No  . Drug use: No  . Sexual activity: Yes    Birth control/ protection: None   Other Topics Concern  . Not on file   Social History Narrative  . No narrative on file    Past Surgical Hx:  Past Surgical History:  Procedure Laterality Date  . DILATION AND CURETTAGE OF UTERUS  02/11/12   Hysteroscopy with versapoint resection/myomectomy/  . INCISION AND DRAINAGE OF WOUND Right 01/13/2013   Procedure: IRRIGATION AND DEBRIDEMENT OF NAIL BED ABLATION AND FULL THICKNESS SKIN GRAFT ;  Surgeon: Schuyler Amor, MD;  Location: [facility];  Service: Orthopedics;  Laterality: Right;  . ROBOTIC ASSISTED LAP VAGINAL HYSTERECTOMY N/A 03/19/2016   Procedure: XI ROBOTIC ASSISTED LAPAROSCOPIC RADICAL HYSTERECTOMY;  Surgeon: Everitt Amber, MD;  Location: [facility];  Service: Gynecology;  Laterality: N/A;  . ROBOTIC ASSISTED SALPINGO OOPHERECTOMY Bilateral 03/19/2016   Procedure: XI ROBOTIC ASSISTED SALPINGO OOPHORECTOMY;  Surgeon: Everitt Amber, MD;  Location: [facility];  Service: Gynecology;  Laterality: Bilateral;  . SENTINEL NODE BIOPSY Bilateral 03/19/2016   Procedure: PELVIC LYMPH NODE BIOPSY;  Surgeon: Everitt Amber, MD;  Location: [facility];  Service: Gynecology;   Laterality: Bilateral;  . WISDOM TOOTH EXTRACTION      Past Medical Hx:  Past Medical History:  Diagnosis Date  . Anemia   . Fibroid (bleeding) (uterine) 2012  . SVD (spontaneous vaginal delivery)    x 2  . Wears glasses    to read    Family Hx:  Family History  Problem Relation Age of Onset  . Hypertension Mother     Vitals:  Blood pressure 120/72, pulse 79, temperature 98.4 F (36.9 C), temperature source Oral, resp. rate 18, height 5\' 4"  (1.626 m), weight 183 lb (83 kg), last menstrual period 02/06/2016, SpO2 99 %.  Physical Exam:  General: Well developed, well nourished female in no acute distress. Alert and oriented x 3.  Cardiovascular: Regular rate and rhythm. S1 and S2 normal.  Lungs: Clear to auscultation bilaterally. No wheezes/crackles/rhonchi noted.  Skin: No rashes or lesions present. Back: No CVA tenderness.  Abdomen: Abdomen soft, non-tender and obese. Active bowel sounds in all quadrants. No evidence of a fluid wave or abdominal masses.  Lap sites healing without erythema or drainage.    Extremities: No bilateral cyanosis, edema, or clubbing.   180 cc of sterile saline instilled in the bladder per Dr. Denman George.  Patient tolerated well  and foley catheter removed without difficulty.  She went to void immediately but missed the measuring hat.  She states she voided a moderate amount.  Patient did not want to wait in the office for another void.  Assessment/Plan:  45 year old female s/p robotic-assisted type III radical laparoscopic hysterectomy with bilateral salpingectomy and bilateral pelvic lymphadenectomy on 03/19/16 for squamous cell carcinoma of the cervix.  She is doing well post-operatively at this time and has voided since foley removal.  Reportable signs and symptoms reviewed.  Patient and family advised that pathology and Dr. Serita Grit recommendations would be discussed at her next office visit on 04/08/16.  She is advised to call the office for any questions or  concerns in between that time.     Caitlin Woodburn DEAL, NP 04/05/2016, 2:21 PM

## 2016-04-08 ENCOUNTER — Encounter: Payer: Self-pay | Admitting: Gynecologic Oncology

## 2016-04-08 ENCOUNTER — Ambulatory Visit: Payer: BLUE CROSS/BLUE SHIELD | Attending: Gynecologic Oncology | Admitting: Gynecologic Oncology

## 2016-04-08 VITALS — BP 137/74 | HR 81 | Temp 98.2°F | Resp 18 | Ht 64.0 in | Wt 186.6 lb

## 2016-04-08 DIAGNOSIS — Z8249 Family history of ischemic heart disease and other diseases of the circulatory system: Secondary | ICD-10-CM | POA: Insufficient documentation

## 2016-04-08 DIAGNOSIS — Z9889 Other specified postprocedural states: Secondary | ICD-10-CM | POA: Insufficient documentation

## 2016-04-08 DIAGNOSIS — I890 Lymphedema, not elsewhere classified: Secondary | ICD-10-CM | POA: Diagnosis not present

## 2016-04-08 DIAGNOSIS — F5101 Primary insomnia: Secondary | ICD-10-CM

## 2016-04-08 DIAGNOSIS — Z79899 Other long term (current) drug therapy: Secondary | ICD-10-CM | POA: Insufficient documentation

## 2016-04-08 DIAGNOSIS — G4700 Insomnia, unspecified: Secondary | ICD-10-CM | POA: Diagnosis not present

## 2016-04-08 DIAGNOSIS — Z888 Allergy status to other drugs, medicaments and biological substances status: Secondary | ICD-10-CM | POA: Insufficient documentation

## 2016-04-08 DIAGNOSIS — C539 Malignant neoplasm of cervix uteri, unspecified: Secondary | ICD-10-CM | POA: Insufficient documentation

## 2016-04-08 MED ORDER — ZOLPIDEM TARTRATE 5 MG PO TABS: 5.0000 mg | ORAL_TABLET | Freq: Every evening | ORAL | 0 refills | Status: DC | PRN

## 2016-04-08 NOTE — Progress Notes (Signed)
Gynecologic Oncology Multi-Disciplinary Disposition Conference Note  Date of the Conference: April 08, 2016  Patient Name: Caitlin Flowers  Referring Provider: Dr. Roselie Awkward Primary GYN Oncologist: Dr. Everitt Amber  Stage/Disposition:  Stage IB1 cervical carcinoma with intermediate risk factors.  Disposition is to whole pelvic radiation therapy.   This Multidisciplinary conference took place involving physicians from [place], [place], Radiation Oncology, Pathology, Radiology along with the Gynecologic Oncology Nurse Practitioner and RN.  Comprehensive assessment of the patient's malignancy, staging, need for surgery, chemotherapy, radiation therapy, and need for further testing were reviewed. Supportive measures, both inpatient and following discharge were also discussed. The recommended plan of care is documented. Greater than 35 minutes were spent correlating and coordinating this patient's care.

## 2016-04-08 NOTE — Patient Instructions (Addendum)
We will refer you to our radiation oncology department for the next step in your treatment and will see you in our clinic after radiation treatment is complete. We will refer you to physical therapy to evaluate the swelling in your lower leg, someone will call you with those appointment details. You will get a prescription for sleep medication. Please refrain from sex or inserting anything vaginally until February. Please call our clinic at any time if you have questions or concerns.

## 2016-04-08 NOTE — Progress Notes (Signed)
Consult Note: Gyn-Onc  Consult was requested by Dr. Roselie Awkward for the evaluation of Caitlin Flowers 45 y.o. female  CC:  Chief Complaint  Patient presents with  . Cervical Cancer    Assessment/Plan:  [name]  is a 45 y.o.  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 Intermediate risk factors (outer third cervical stromal involvement and LVSI present) on final pathology. Recommendation is for adjuvant whole pelvic RT.  I discussed this recommendation with the patient in the presence of an interpretor. We discussed the risk for recurrence and the potential for decreased risk after radiation is administered which is in accordance with NCCN guidelines.  She is doing well from a post-op standpoint.  Her LLE lymphedema is persistent postop (note, it was present preop). We will refer to PT.  Refer to Dr Sondra Come for adjuvant radiation.   Prescribed ambien for insomnia. Provided with information regarding risks.  HPI: Caitlin Flowers is a 45 year old [place] woman (speaks Amharic) who is seen in consultation at the request of Dr Roselie Awkward for high grade SCC of the cervix.  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 She was recommended to return for colposcopy but failed to do so.  In August, 2017 she began noticing edema of her left lower extremity. She was evaluated in the ED with a doppler of the lower extremity which was negative for DVT and a CT abdo/pelvis which showed no hydronephrosis or hydroureter, small uterine fibroids, but no lesions to explain her LE edema.  She followed up with Dr Roselie Awkward on 01/10/16 and proceed with colposcopy.   At the time of colposcopy a visible lesion was seen on the anterior lip of the cervix. It was biopsied and returned as high grade squamous cell carcinoma. Endocervical curette revealed CIN III.  The patient is otherwise healthy. SHe has had no  prior abdominal surgeries, though has had a hysteroscopic resection of a myoma in the past. She denies HIV exposure and is a non-smoker.  She denies intermenstrual bleeding and abnormal discharge.  Interval Hx:  On 02/07/16 PET/CT was performed and showed no evidence of extracervical spread (including no lymphadenopathy) or explanation for LE lymphedema.  This was originally scheduled for October, 2017, however, the week before surgery, the patient cancelled her surgery based on the information she was provided by her family members.  I informed the patient that delays in treatment of her cancer could upstage her disease or make cure less likely.   On 03/19/16 she underwent robotic assisted radical hysterectomy, bilateral salpingectomy and lymphadenectomy and ovarian pexy. Final pathology showed a 3.5cm moderately differentiated squamous lesion with 12 of 23mm stromal invasion and LVSI present. Lymph nodes were negative.  She was admitted postop for colonic ileus which resolved with bowel rest. She successfully passed her voiding trial at 1 week.  She was recommended to receive adjuvant external beam radiation due to her intermediate risk factors on pathology.  Since surgery she is doing well. Voiding normally. Minimal pain. No bleeding. Edema is persistent in LLE. She has difficulty sleeping.   Current Meds:  Outpatient Encounter Prescriptions as of 04/08/2016  Medication Sig  . ibuprofen (ADVIL,MOTRIN) 800 MG tablet Take 1 tablet (800 mg total) by mouth every 8 (eight) hours as needed (mild pain).  Marland Kitchen oxyCODONE-acetaminophen (PERCOCET/ROXICET) 5-325 MG tablet Take 1-2 tablets by mouth every 4 (four) hours as needed (moderate to severe pain (  when tolerating fluids)).  Marland Kitchen senna (SENOKOT) 8.6 MG TABS tablet Take 1 tablet (8.6 mg total) by mouth at bedtime. Use while taking percocet or if constipated  . zolpidem (AMBIEN) 5 MG tablet Take 1 tablet (5 mg total) by mouth at bedtime as needed for  sleep.   No facility-administered encounter medications on file as of 04/08/2016.     Allergy:  Allergies  Allergen Reactions  . Influenza Vaccines Shortness Of Breath    Flu like symptoms     Social Hx:   Social History   Social History  . Marital status: Legally Separated    Spouse name: N/A  . Number of children: N/A  . Years of education: N/A   Occupational History  . Not on file.   Social History Main Topics  . Smoking status: Never Smoker  . Smokeless tobacco: Never Used  . Alcohol use Not on file  . Drug use: Unknown  . Sexual activity: Yes    Birth control/ protection: None   Other Topics Concern  . Not on file   Social History Narrative  . No narrative on file    Past Surgical Hx:  Past Surgical History:  Procedure Laterality Date  . DILATION AND CURETTAGE OF UTERUS  02/11/12   Hysteroscopy with versapoint resection/myomectomy/  . INCISION AND DRAINAGE OF WOUND Right 01/13/2013   Procedure: IRRIGATION AND DEBRIDEMENT OF NAIL BED ABLATION AND FULL THICKNESS SKIN GRAFT ;  Surgeon: Schuyler Amor, MD;  Location: [facility];  Service: Orthopedics;  Laterality: Right;  . ROBOTIC ASSISTED LAP VAGINAL HYSTERECTOMY N/A 03/19/2016   Procedure: XI ROBOTIC ASSISTED LAPAROSCOPIC RADICAL HYSTERECTOMY;  Surgeon: Everitt Amber, MD;  Location: [facility];  Service: Gynecology;  Laterality: N/A;  . ROBOTIC ASSISTED SALPINGO OOPHERECTOMY Bilateral 03/19/2016   Procedure: XI ROBOTIC ASSISTED SALPINGO OOPHORECTOMY;  Surgeon: Everitt Amber, MD;  Location: [facility];  Service: Gynecology;  Laterality: Bilateral;  . SENTINEL NODE BIOPSY Bilateral 03/19/2016   Procedure: PELVIC LYMPH NODE BIOPSY;  Surgeon: Everitt Amber, MD;  Location: [facility];  Service: Gynecology;  Laterality: Bilateral;  . WISDOM TOOTH EXTRACTION      Past Medical Hx:  Past Medical History:  Diagnosis Date  . Anemia   . Fibroid (bleeding) (uterine) 2012  . SVD (spontaneous vaginal delivery)    x 2   . Wears glasses    to read    Past Gynecological History:  SVD x 2 Patient's last menstrual period was 02/06/2016.  Family Hx:  Family History  Problem Relation Age of Onset  . Hypertension Mother     Review of Systems:  Constitutional  Feels well,    ENT Normal appearing ears and nares bilaterally Skin/Breast  No rash, sores, jaundice, itching, dryness Cardiovascular  No chest pain, shortness of breath, or edema  Pulmonary  No cough or wheeze.  Gastro Intestinal  No nausea, vomitting, or diarrhoea. No bright red blood per rectum, no abdominal pain, change in bowel movement, or constipation.  Genito Urinary  No frequency, urgency, dysuria, no bleeding Musculo Skeletal  + LLE edema 3+ (worse with activity, improved with elevation) Neurologic  No weakness, numbness, change in gait,  Psychology  No depression, anxiety, insomnia.   Vitals:  Blood pressure 137/74, pulse 81, temperature 98.2 F (36.8 C), temperature source Oral, resp. rate 18, height 5\' 4"  (1.626 m), weight 186 lb 9.6 oz (84.6 kg), last menstrual period 02/06/2016, SpO2 100 %.  Physical Exam: WD in NAD Neck  Supple NROM,  without any enlargements.  Lymph Node Survey No cervical supraclavicular or inguinal adenopathy however there is fullness in the left inguinal region. Cardiovascular  Pulse normal rate, regularity and rhythm. S1 and S2 normal.  Lungs  Clear to auscultation bilateraly, without wheezes/crackles/rhonchi. Good air movement.  Skin  No rash/lesions/breakdown  Psychiatry  Alert and oriented to person, place, and time  Abdomen  Normoactive bowel sounds, abdomen soft, non-tender and nonobese without evidence of hernia. Well healed abdominal incisions. Back No CVA tenderness Genito Urinary  Vulva/vagina: normal external genitalia. Well healed vaginal cuff. Surgically absent cervix and uterus.  Rectal  Good tone, no masses no cul de sac nodularity. No parametrial infiltration  appreciated. Extremities  +2 left LE edema   30 minutes of direct face to face counseling time was spent with the patient. This included discussion about prognosis, therapy recommendations and postoperative side effects and are beyond the scope of routine postoperative care.   [name], MD  04/08/2016, 5:15 PM

## 2016-04-10 ENCOUNTER — Encounter: Payer: Self-pay | Admitting: Radiation Oncology

## 2016-04-10 ENCOUNTER — Telehealth: Payer: Self-pay | Admitting: *Deleted

## 2016-04-10 ENCOUNTER — Encounter: Payer: Self-pay | Admitting: *Deleted

## 2016-04-10 NOTE — Progress Notes (Signed)
GYN Location of Tumor / Histology:  Stage IB1 cervical carcinoma   Caitlin Flowers presented with symptoms of: edema of her left lower extremity  Biopsies revealed:   03/19/16 FINAL DIAGNOSIS Diagnosis 1. Lymph nodes, regional resection, right pelvic - FIVE BENIGN LYMPH NODES (0/5). 2. Lymph nodes, regional resection, left pelvic - SIX BENIGN LYMPH NODES (0/6). 3. Uterus and bilateral fallopian tubes, with cervix - UTERINE CERVIX: INVASIVE MODERATELY DIFFERENTIATED SQUAMOUS CELL CARCINOMA, 3.5 CM. - LYMPHOVASCULAR INVOLVEMENT BY TUMOR. - VAGINAL CUFF MARGIN FREE OF TUMOR. - INVASIVE CARCINOMA FOCALLY LESS THAN 0.1 CM FROM SURGICAL MARGIN. - UTERINE CORPUS AND BILATERAL FALLOPIAN TUBES FREE OF TUMOR. - ONE BENIGN PARAMETRIAL LYMPH NODE (0/1). 4. Vaginal resection, anterior vaginal margin - BENIGN SQUAMOUS MUCOSA. - NO DYSPLASIA OR MALIGNANCY  Past/Anticipated interventions by Gyn/Onc surgery, if any: 03/19/16 -Procedure: XI ROBOTIC ASSISTED LAPAROSCOPIC RADICAL HYSTERECTOMY andXI ROBOTIC ASSISTED SALPINGO OOPHORECTOMY ;  Surgeon: Everitt Amber, MD  Past/Anticipated interventions by medical oncology, if any: no  Weight changes, if any: no  Bowel/Bladder complaints, if any: No.  Nausea/Vomiting, if any: no  Pain issues, if any:  Yes - reports having right arm pain that started today.  SAFETY ISSUES:  Prior radiation? no  Pacemaker/ICD? no  Possible current pregnancy? no  Is the patient on methotrexate? no  Current Complaints / other details:  Dr. Serita Grit recommendation is for adjuvant whole pelvic RT.  Patient is from [place] and is with an interpreter.  She reports having some occasional vaginal itching.  She denies having any vaginal bleeding.  BP 139/85 (BP Location: Right Arm, Patient Position: Sitting)   Pulse 67   Temp 98.2 F (36.8 C) (Oral)   Ht 5\' 4"  (1.626 m)   Wt 189 lb 6.4 oz (85.9 kg)   LMP 02/06/2016   SpO2 100%   BMI 32.51 kg/m    Wt Readings from  Last 3 Encounters:  04/24/16 189 lb 6.4 oz (85.9 kg)  04/08/16 186 lb 9.6 oz (84.6 kg)  03/27/16 183 lb (83 kg)

## 2016-04-10 NOTE — Progress Notes (Signed)
[place] Work  Clinical Social Work was referred by Therapist, sports for financial concerns.  Clinical Social Worker met with patient and son-in-law in [place] lobby to offer support and assess for needs.  Patient's son in law shared patient needs help with medical bills.  CSW unaware of help for medical bills at this time.  CSW will notify radiation financial counseling team and request they meet with patient at radiation visit.  CSW will follow up with patient regarding additional community resources such as Dietitian.   Polo Riley, MSW, LCSW, OSW-C Clinical Social Worker [facility] [phone]

## 2016-04-10 NOTE — Telephone Encounter (Signed)
Call from pt's son in law requesting social worker contact them to help with any resources she may be eligible for. Recommended he contact DSS. He requests call from Education officer, museum.  Message forwarded to [place]. Melissa, APP made aware of family's request.

## 2016-04-24 ENCOUNTER — Encounter: Payer: Self-pay | Admitting: Radiation Oncology

## 2016-04-24 ENCOUNTER — Ambulatory Visit
Admission: RE | Admit: 2016-04-24 | Discharge: 2016-04-24 | Disposition: A | Discharge status: Home / Self Care | Payer: BLUE CROSS/BLUE SHIELD | Source: Ambulatory Visit | Attending: Radiation Oncology | Admitting: Radiation Oncology

## 2016-04-24 VITALS — BP 139/85 | HR 67 | Temp 98.2°F | Ht 64.0 in | Wt 189.4 lb

## 2016-04-24 DIAGNOSIS — C531 Malignant neoplasm of exocervix: Secondary | ICD-10-CM

## 2016-04-24 DIAGNOSIS — M79601 Pain in right arm: Secondary | ICD-10-CM | POA: Insufficient documentation

## 2016-04-24 DIAGNOSIS — Z79899 Other long term (current) drug therapy: Secondary | ICD-10-CM | POA: Insufficient documentation

## 2016-04-24 DIAGNOSIS — Z510 Encounter for antineoplastic radiation therapy: Secondary | ICD-10-CM | POA: Diagnosis not present

## 2016-04-24 DIAGNOSIS — Z9889 Other specified postprocedural states: Secondary | ICD-10-CM | POA: Diagnosis not present

## 2016-04-24 DIAGNOSIS — Z888 Allergy status to other drugs, medicaments and biological substances status: Secondary | ICD-10-CM | POA: Diagnosis not present

## 2016-04-24 DIAGNOSIS — Z8249 Family history of ischemic heart disease and other diseases of the circulatory system: Secondary | ICD-10-CM | POA: Diagnosis not present

## 2016-04-24 DIAGNOSIS — C539 Malignant neoplasm of cervix uteri, unspecified: Secondary | ICD-10-CM

## 2016-04-24 DIAGNOSIS — Z90710 Acquired absence of both cervix and uterus: Secondary | ICD-10-CM | POA: Diagnosis not present

## 2016-04-24 HISTORY — DX: Malignant neoplasm of cervix uteri, unspecified: C53.9

## 2016-04-24 NOTE — Progress Notes (Signed)
Financial Counseling--Spoke with patient today with interpretor--she is going to bring in a letter of support for [place] to evaluate for [employer] grant--gave her a cancer care form

## 2016-04-24 NOTE — Progress Notes (Signed)
Please see the Nurse Progress Note in the MD Initial Consult Encounter for this patient. 

## 2016-04-24 NOTE — Progress Notes (Signed)
Radiation Oncology         [phone]) [phone] ________________________________  Initial Outpatient Consultation  Name: Caitlin Flowers MRN: [id number]  Date: 04/24/2016  DOB: 24-Dec-1970  CC:No PCP Per Patient  Everitt Amber, MD   REFERRING PHYSICIAN: Everitt Amber, MD  DIAGNOSIS:Stage IB1 squamous cell carcinoma of the cervix (moderately differentiated)  HISTORY OF PRESENT ILLNESS::Caitlin Flowers is a 46 y.o. female who presented for a routine pap test in 2014. This returned as HGSIL. The patient was advised to return for a colposcopy, but she failed to do so.   In August 2017, patient began noticing edema of her left lower extremity. Dr. Roselie Awkward performed a colposcopy on 01/10/16, this revealed a visible lesion on the anterior lip of the cervix. Biopsy revealed high grade squamous cell carcinoma. Endocervical curette revealed CIN III.   Patient underwent  robotic assisted laparoscopic radical hysterectomy and  robotic assisted salpingo ooporectomy on 03/19/16. This revealed five benign lymph nodes from the right pelvic (0/5) and six benign lymph nodes from the left pelvic (0/6). There was invasive moderately differentiated squamous cell carcinoma spanning 3.5 cm in the uterine cervix with lymphovascular involvement. Vaginal resection,  anterior vaginal margin revealed benign squamous mucosa with no dysplasia or malignancy.   Patient notes mild right arm pain that began today, she also notes occasional vaginal itching. Patient denies weight changes, nausea/ vomiting, vaginal bleeding, or bowel/ bladder complaints at this time.  Of note, patient is from [place] and is with an interpreter.  PREVIOUS RADIATION THERAPY: No  PAST MEDICAL HISTORY:  has a past medical history of Anemia; Cervical cancer ([facility]); Fibroid (bleeding) (uterine) (2012); SVD (spontaneous vaginal delivery); and Wears glasses.    PAST SURGICAL HISTORY: Past Surgical History:  Procedure Laterality Date  . DILATION AND CURETTAGE OF  UTERUS  02/11/12   Hysteroscopy with versapoint resection/myomectomy/  . INCISION AND DRAINAGE OF WOUND Right 01/13/2013   Procedure: IRRIGATION AND DEBRIDEMENT OF NAIL BED ABLATION AND FULL THICKNESS SKIN GRAFT ;  Surgeon: Schuyler Amor, MD;  Location: [facility];  Service: Orthopedics;  Laterality: Right;  . ROBOTIC ASSISTED LAP VAGINAL HYSTERECTOMY N/A 03/19/2016   Procedure: XI ROBOTIC ASSISTED LAPAROSCOPIC RADICAL HYSTERECTOMY;  Surgeon: Everitt Amber, MD;  Location[facility];  Service: Gynecology;  Laterality: N/A;  . ROBOTIC ASSISTED SALPINGO OOPHERECTOMY Bilateral 03/19/2016   Procedure: XI ROBOTIC ASSISTED SALPINGO OOPHORECTOMY;  Surgeon: Everitt Amber, MD;  [facility];  Service: Gynecology;  Laterality: Bilateral;  . SENTINEL NODE BIOPSY Bilateral 03/19/2016   Procedure: PELVIC LYMPH NODE BIOPSY;  Surgeon: Everitt Amber, MD;  Location[facility];  Service: Gynecology;  Laterality: Bilateral;  . WISDOM TOOTH EXTRACTION      FAMILY HISTORY: family history includes Hypertension in her mother.  SOCIAL HISTORY:  reports that she has never smoked. She has never used smokeless tobacco. She reports that she does not drink alcohol or use drugs.  ALLERGIES: Influenza vaccines  MEDICATIONS:  Current Outpatient Prescriptions  Medication Sig Dispense Refill  . zolpidem (AMBIEN) 5 MG tablet Take 1 tablet (5 mg total) by mouth at bedtime as needed for sleep. 30 tablet 0  . ibuprofen (ADVIL,MOTRIN) 800 MG tablet Take 1 tablet (800 mg total) by mouth every 8 (eight) hours as needed (mild pain). (Patient not taking: Reported on 04/24/2016) 30 tablet 0  . oxyCODONE-acetaminophen (PERCOCET/ROXICET) 5-325 MG tablet Take 1-2 tablets by mouth every 4 (four) hours as needed (moderate to severe pain (when tolerating fluids)). (Patient not taking: Reported on  04/24/2016) 30 tablet 0  . senna (SENOKOT) 8.6 MG TABS tablet Take 1 tablet (8.6 mg total) by mouth at bedtime. Use while taking percocet or if  constipated (Patient not taking: Reported on 04/24/2016) 120 each 0   No current facility-administered medications for this encounter.     REVIEW OF SYSTEMS:  A 12 point review of systems is documented in the electronic medical record. This was obtained by the nursing staff. However, I reviewed this with the patient to discuss relevant findings and make appropriate changes.     PHYSICAL EXAM:  height is 5\' 4"  (1.626 m) and weight is 189 lb 6.4 oz (85.9 kg). Her oral temperature is 98.2 F (36.8 C). Her blood pressure is 139/85 and her pulse is 67. Her oxygen saturation is 100%.   General: Alert and oriented, in no acute distress HEENT: Head is normocephalic. Extraocular movements are intact. Oropharynx is clear. Neck: Neck is supple, no palpable cervical or supraclavicular lymphadenopathy. Heart: Regular in rate and rhythm with no murmurs, rubs, or gallops. Chest: Clear to auscultation bilaterally, with no rhonchi, wheezes, or rales. Abdomen: Soft, nontender, nondistended, with no rigidity or guarding. Five small scars from laprascopic procedure are healing well with no signs of drainage or infection. Extremities: No cyanosis or edema. Lymphatics: No palpable cords within the left lower extremity or arm areas Skin: No concerning lesions. Musculoskeletal: symmetric strength and muscle tone throughout. Neurologic: Cranial nerves II through XII are grossly intact. No obvious focalities. Speech is fluent. Coordination is intact. Psychiatric: Judgment and insight are intact. Affect is appropriate. Pelvis: External genitalia were unremarkable. A speculum exam was performed. There are no mucosal lesions noted in the vaginal vault. Sutures were noted in the vaginal cuff. On bimanual examination there were no pelvic masses appreciated, Vaginal cuff intact   ECOG = 1  LABORATORY DATA:  Lab Results  Component Value Date   WBC 7.4 03/23/2016   HGB 11.5 (L) 03/23/2016   HCT 34.3 (L) 03/23/2016   MCV  91.5 03/23/2016   PLT 176 03/23/2016   NEUTROABS 7.4 (H) 03/21/2016   Lab Results  Component Value Date   NA 137 03/23/2016   K 3.9 03/23/2016   CL 103 03/23/2016   CO2 28 03/23/2016   GLUCOSE 120 (H) 03/23/2016   CREATININE 0.55 03/23/2016   CALCIUM 8.8 (L) 03/23/2016      RADIOGRAPHY: No results found.    IMPRESSION: Stage IB1 squamous cell carcinoma of the cervix (moderately differentiated). The patient underwent a radical hysterectomy with pathological findings showing a deeply invasive tumor with lymphovascular space invasion . Patient would be at risk for recurrence and would agree with Dr. Serita Grit recommendations of post operative radiation directed at the pelvis. I discussed the course of treatment, side effects, and potential complications with the patient using an interpreter. She appears to understand and wishes to proceed with post operative treatment.  A consent form was signed and a copy was placed in the patient's chart.  PLAN: CT simulation and treatment planning is scheduled for 9:15 on Thursday. Anticipate treatment to begin ~ 6 weeks post-op. Patient will receive 5.5 weeks of radiation therapy directed at the pelvis. I am recommending IMRT to limit dose to the small bowel and bone marrow.    ------------------------------------------------  Blair Promise, PhD, MD  This document serves as a record of services personally performed by Gery Pray, MD. It was created on his behalf by Bethann Humble, a trained medical scribe. The creation of this  record is based on the scribe's personal observations and the provider's statements to them. This document has been checked and approved by the attending provider.

## 2016-04-25 ENCOUNTER — Ambulatory Visit
Admission: RE | Admit: 2016-04-25 | Discharge: 2016-04-25 | Disposition: A | Discharge status: Home / Self Care | Payer: BLUE CROSS/BLUE SHIELD | Source: Ambulatory Visit | Attending: Radiation Oncology | Admitting: Radiation Oncology

## 2016-04-25 DIAGNOSIS — Z510 Encounter for antineoplastic radiation therapy: Secondary | ICD-10-CM | POA: Diagnosis not present

## 2016-04-25 DIAGNOSIS — C531 Malignant neoplasm of exocervix: Secondary | ICD-10-CM

## 2016-04-25 NOTE — Progress Notes (Signed)
Does patient have an allergy to IV contrast dye?: No.   Has patient ever received premedication for IV contrast dye?: No.   Does patient take metformin?: No.  If patient does take metformin when was the last dose: n/a  Date of lab work: March 23, 2016 BUN: 8.0  CR: 0.55   IV site:

## 2016-04-25 NOTE — Progress Notes (Signed)
  Radiation Oncology         (336) [phone] ________________________________  Name: Caitlin Flowers MRN: [id number]  Date: 04/25/2016  DOB: February 26, 1971  SIMULATION AND TREATMENT PLANNING NOTE    ICD-9-CM ICD-10-CM   1. Malignant neoplasm of exocervix (HCC) 180.1 C53.1     DIAGNOSIS: Stage IB1 squamous cell carcinoma of the cervix (moderately differentiated)  NARRATIVE:  The patient was brought to the [facility].  Identity was confirmed.  All relevant records and images related to the planned course of therapy were reviewed.  The patient freely provided informed written consent to proceed with treatment after reviewing the details related to the planned course of therapy. The consent form was witnessed and verified by the simulation staff.  Then, the patient was set-up in a stable reproducible  supine position for radiation therapy.  CT images were obtained.  Surface markings were placed.  The CT images were loaded into the planning software.  Then the target and avoidance structures were contoured.  Treatment planning then occurred.  The radiation prescription was entered and confirmed.  Then, I designed and supervised the construction of a total of 2 medically necessary complex treatment devices.  I have requested : dose calc.  Dose volume histograms of the PTV, bladder rectum and femoral head and neck area and pelvic bone and bowel are requested.  PLAN:  The patient will receive 45 Gy in 25 fractions directed at the Pelvis region. Intensity modulated radiation therapy is recommended to limit dose to the small bowel and bone marrow. After 45 gray the patient will likely proceed with intracavitary brachytherapy treatments directed at the vaginal cuff given the close surgical margin (less than 1 mm). Patient will likely receive 3 intracavitary treatments with a brachytherapy dose of 18 gray.  -----------------------------------  Blair Promise, PhD, MD  This document serves as a record  of services personally performed by Gery Pray, MD. It was created on his behalf by Darcus Austin, a trained medical scribe. The creation of this record is based on the scribe's personal observations and the provider's statements to them. This document has been checked and approved by the attending provider.

## 2016-05-01 ENCOUNTER — Encounter: Payer: Self-pay | Admitting: Gynecologic Oncology

## 2016-05-01 DIAGNOSIS — Z510 Encounter for antineoplastic radiation therapy: Secondary | ICD-10-CM | POA: Diagnosis not present

## 2016-05-06 ENCOUNTER — Ambulatory Visit
Admission: RE | Admit: 2016-05-06 | Discharge: 2016-05-06 | Disposition: A | Discharge status: Home / Self Care | Payer: BLUE CROSS/BLUE SHIELD | Source: Ambulatory Visit | Attending: Radiation Oncology | Admitting: Radiation Oncology

## 2016-05-06 ENCOUNTER — Encounter: Payer: Self-pay | Admitting: Oncology

## 2016-05-06 ENCOUNTER — Ambulatory Visit
Admission: RE | Admit: 2016-05-06 | Payer: BLUE CROSS/BLUE SHIELD | Source: Ambulatory Visit | Admitting: Radiation Oncology

## 2016-05-06 DIAGNOSIS — C531 Malignant neoplasm of exocervix: Secondary | ICD-10-CM

## 2016-05-06 NOTE — Progress Notes (Addendum)
Pt here for patient teaching.  Pt given Radiation and You booklet. Reviewed areas of pertinence such as diarrhea, fatigue, nausea and vomiting, skin changes and urinary and bladder changes . Pt able to give teach back of to pat skin, use unscented/gentle soap, have Imodium on hand, drink plenty of water and sitz bath,avoid applying anything to skin within 4 hours of treatment. Pt demonstrated understanding and verbalizes understanding of information given and will contact nursing with any questions or concerns.  Education was done with the assistance of an interpreter.

## 2016-05-07 ENCOUNTER — Ambulatory Visit: Payer: BLUE CROSS/BLUE SHIELD

## 2016-05-07 ENCOUNTER — Encounter: Payer: Self-pay | Admitting: Radiation Oncology

## 2016-05-07 ENCOUNTER — Ambulatory Visit
Admission: RE | Admit: 2016-05-07 | Discharge: 2016-05-07 | Disposition: A | Discharge status: Home / Self Care | Payer: BLUE CROSS/BLUE SHIELD | Source: Ambulatory Visit | Attending: Radiation Oncology | Admitting: Radiation Oncology

## 2016-05-07 VITALS — BP 133/71 | HR 68 | Temp 98.2°F | Ht 64.0 in | Wt 190.0 lb

## 2016-05-07 DIAGNOSIS — C539 Malignant neoplasm of cervix uteri, unspecified: Secondary | ICD-10-CM

## 2016-05-07 DIAGNOSIS — Z510 Encounter for antineoplastic radiation therapy: Secondary | ICD-10-CM | POA: Diagnosis not present

## 2016-05-07 NOTE — Progress Notes (Signed)
  Radiation Oncology         [phone] ________________________________  Name: Caitlin Flowers MRN: [id number]  Date: 05/07/2016  DOB: 05-04-1970  Simulation Verification Note    ICD-9-CM ICD-10-CM   1. Primary cervical cancer (HCC) 180.9 C53.9     Status: outpatient  NARRATIVE: The patient was brought to the treatment unit and placed in the planned treatment position. The clinical setup was verified. Then port films were obtained and uploaded to the radiation oncology medical record software.  The treatment beams were carefully compared against the planned radiation fields. The position location and shape of the radiation fields was reviewed. They targeted volume of tissue appears to be appropriately covered by the radiation beams. Organs at risk appear to be excluded as planned.  Based on my personal review, I approved the simulation verification. The patient's treatment will proceed as planned.  -----------------------------------  Blair Promise, PhD, MD

## 2016-05-07 NOTE — Progress Notes (Signed)
Caitlin Flowers has completed 1 fraction to her pelvis.  She denies having pain, bladder/bowel issues or vaginal bleeding.  She is here with an interpreter.  BP 133/71 (BP Location: Left Arm, Patient Position: Sitting)   Pulse 68   Temp 98.2 F (36.8 C) (Oral)   Ht 5\' 4"  (1.626 m)   Wt 190 lb (86.2 kg)   LMP 02/06/2016   SpO2 100%   BMI 32.61 kg/m    Wt Readings from Last 3 Encounters:  05/07/16 190 lb (86.2 kg)  04/25/16 190 lb (86.2 kg)  04/24/16 189 lb 6.4 oz (85.9 kg)

## 2016-05-07 NOTE — Progress Notes (Signed)
  Radiation Oncology         (336) [phone] ________________________________  Name: Caitlin Flowers MRN: [id number]  Date: 05/07/2016  DOB: Aug 23, 1970  Weekly Radiation Therapy Management    ICD-9-CM ICD-10-CM   1. Primary cervical cancer (HCC) 180.9 C53.9      Current Dose: 1.8 Gy     Planned Dose:  45+ Gy  Narrative . . . . . . . . The patient presents for routine under treatment assessment.                                    The patient has completed 1 fraction to her pelvis. She denies pain, bladder/bowel issues, or vaginal bleeding. She presents with an interpreter.                                  Set-up films were reviewed.                                 The chart was checked. Physical Findings. . .  height is 5\' 4"  (1.626 m) and weight is 190 lb (86.2 kg). Her oral temperature is 98.2 F (36.8 C). Her blood pressure is 133/71 and her pulse is 68. Her oxygen saturation is 100%.   Lungs are clear to auscultation bilaterally. Heart has regular rate and rhythm. Abdomen soft, non-tender, normal bowel sounds.  Impression . . . . . . . The patient is tolerating radiation. Plan . . . . . . . . . . . . Continue treatment as planned. The patient and family had questions regarding her schedule and the patient having time off from work. Family states that her employer would like her back to work by the beginning of March, but she would be scheduled for three HDR brachytherapy treatments during March. I advised it would likely be best for her to return to work in April. We will make a definitive schedule for the HDR treatments so that the patient could provide her employer a definitive return date for work. ________________________________   Blair Promise, PhD, MD  This document serves as a record of services personally performed by Gery Pray, MD. It was created on his behalf by Darcus Austin, a trained medical scribe. The creation of this record is based on the scribe's personal observations  and the provider's statements to them. This document has been checked and approved by the attending provider.

## 2016-05-07 NOTE — Progress Notes (Signed)
Financial Counselor--Spoke with patient and family today--she brought in her income verification--qualifies for 400.00 [place] grant--information scanned into [employer]

## 2016-05-08 ENCOUNTER — Ambulatory Visit: Payer: BLUE CROSS/BLUE SHIELD

## 2016-05-08 ENCOUNTER — Ambulatory Visit: Payer: BLUE CROSS/BLUE SHIELD | Admitting: Radiation Oncology

## 2016-05-09 ENCOUNTER — Ambulatory Visit
Admission: RE | Admit: 2016-05-09 | Discharge: 2016-05-09 | Disposition: A | Discharge status: Home / Self Care | Payer: BLUE CROSS/BLUE SHIELD | Source: Ambulatory Visit | Attending: Radiation Oncology | Admitting: Radiation Oncology

## 2016-05-09 DIAGNOSIS — Z510 Encounter for antineoplastic radiation therapy: Secondary | ICD-10-CM | POA: Diagnosis not present

## 2016-05-10 ENCOUNTER — Ambulatory Visit
Admission: RE | Admit: 2016-05-10 | Discharge: 2016-05-10 | Disposition: A | Discharge status: Home / Self Care | Payer: BLUE CROSS/BLUE SHIELD | Source: Ambulatory Visit | Attending: Radiation Oncology | Admitting: Radiation Oncology

## 2016-05-10 DIAGNOSIS — Z510 Encounter for antineoplastic radiation therapy: Secondary | ICD-10-CM | POA: Diagnosis not present

## 2016-05-13 ENCOUNTER — Ambulatory Visit
Admission: RE | Admit: 2016-05-13 | Discharge: 2016-05-13 | Disposition: A | Discharge status: Home / Self Care | Payer: BLUE CROSS/BLUE SHIELD | Source: Ambulatory Visit | Attending: Radiation Oncology | Admitting: Radiation Oncology

## 2016-05-13 DIAGNOSIS — Z510 Encounter for antineoplastic radiation therapy: Secondary | ICD-10-CM | POA: Diagnosis not present

## 2016-05-14 ENCOUNTER — Ambulatory Visit
Admission: RE | Admit: 2016-05-14 | Discharge: 2016-05-14 | Disposition: A | Discharge status: Home / Self Care | Payer: BLUE CROSS/BLUE SHIELD | Source: Ambulatory Visit | Attending: Radiation Oncology | Admitting: Radiation Oncology

## 2016-05-14 ENCOUNTER — Encounter: Payer: Self-pay | Admitting: Radiation Oncology

## 2016-05-14 VITALS — BP 134/71 | HR 67 | Temp 98.5°F | Ht 64.0 in | Wt 191.2 lb

## 2016-05-14 DIAGNOSIS — C539 Malignant neoplasm of cervix uteri, unspecified: Secondary | ICD-10-CM

## 2016-05-14 DIAGNOSIS — Z510 Encounter for antineoplastic radiation therapy: Secondary | ICD-10-CM | POA: Diagnosis not present

## 2016-05-14 NOTE — Progress Notes (Signed)
  Radiation Oncology         (336) [phone] ________________________________  Name: Caitlin Flowers MRN: [id number]  Date: 05/14/2016  DOB: 02/20/1971  Weekly Radiation Therapy Management    ICD-9-CM ICD-10-CM   1. Primary cervical cancer (HCC) 180.9 C53.9      Current Dose: 9 Gy     Planned Dose:  45+ Gy  Narrative . . . . . . . . The patient presents for routine under treatment assessment.                                    The patient reports nausea and she would suck on  a lemon for this. She states that after radiation, she feels a sensation of burning on her head. An interpreter was present during the encounter.                                  Set-up films were reviewed.                                 The chart was checked. Physical Findings. . .  height is 5\' 4"  (1.626 m) and weight is 191 lb 3.2 oz (86.7 kg). Her oral temperature is 98.5 F (36.9 C). Her blood pressure is 134/71 and her pulse is 67. Her oxygen saturation is 100%.   Lungs are clear to auscultation bilaterally. Heart has regular rate and rhythm. Abdomen soft, non-tender, normal bowel sounds.  Impression . . . . . . . The patient is tolerating radiation. The patient's burning sensation on her head could possibly be from lying on the hard table. Plan . . . . . . . . . . . . Continue treatment as planned. ________________________________   Blair Promise, PhD, MD  This document serves as a record of services personally performed by Gery Pray, MD. It was created on his behalf by Darcus Austin, a trained medical scribe. The creation of this record is based on the scribe's personal observations and the provider's statements to them. This document has been checked and approved by the attending provider.

## 2016-05-14 NOTE — Progress Notes (Addendum)
Caitlin Flowers has completed 5 fractions to her pelvis.  She is here with an interpreter.  She denies having pain, bowel/bladder issues or vaginal bleeding.  She also denies having fatigue.  BP 134/71 (BP Location: Left Arm, Patient Position: Sitting)   Pulse 67   Temp 98.5 F (36.9 C) (Oral)   Ht 5\' 4"  (1.626 m)   Wt 198 lb 3.2 oz (89.9 kg)   LMP 02/06/2016   SpO2 100%   BMI 34.02 kg/m  . Marland Kitchen Wt Readings from Last 3 Encounters:  05/14/16 191 lb 3.2 oz (86.7 kg)  05/07/16 190 lb (86.2 kg)  04/25/16 190 lb (86.2 kg)

## 2016-05-15 ENCOUNTER — Ambulatory Visit
Admission: RE | Admit: 2016-05-15 | Discharge: 2016-05-15 | Disposition: A | Discharge status: Home / Self Care | Payer: BLUE CROSS/BLUE SHIELD | Source: Ambulatory Visit | Attending: Radiation Oncology | Admitting: Radiation Oncology

## 2016-05-15 DIAGNOSIS — Z510 Encounter for antineoplastic radiation therapy: Secondary | ICD-10-CM | POA: Diagnosis not present

## 2016-05-16 ENCOUNTER — Encounter: Payer: Self-pay | Admitting: Radiation Oncology

## 2016-05-16 ENCOUNTER — Ambulatory Visit
Admission: RE | Admit: 2016-05-16 | Discharge: 2016-05-16 | Disposition: A | Discharge status: Home / Self Care | Payer: BLUE CROSS/BLUE SHIELD | Source: Ambulatory Visit | Attending: Radiation Oncology | Admitting: Radiation Oncology

## 2016-05-16 DIAGNOSIS — Z510 Encounter for antineoplastic radiation therapy: Secondary | ICD-10-CM | POA: Diagnosis not present

## 2016-05-16 NOTE — Progress Notes (Signed)
Paperwork (unum) received from doctor, faxed to [phone], confirmation received 1/25, copy given to patient

## 2016-05-17 ENCOUNTER — Ambulatory Visit
Admission: RE | Admit: 2016-05-17 | Discharge: 2016-05-17 | Disposition: A | Discharge status: Home / Self Care | Payer: BLUE CROSS/BLUE SHIELD | Source: Ambulatory Visit | Attending: Radiation Oncology | Admitting: Radiation Oncology

## 2016-05-17 DIAGNOSIS — Z510 Encounter for antineoplastic radiation therapy: Secondary | ICD-10-CM | POA: Diagnosis not present

## 2016-05-20 ENCOUNTER — Ambulatory Visit
Admission: RE | Admit: 2016-05-20 | Discharge: 2016-05-20 | Disposition: A | Discharge status: Home / Self Care | Payer: BLUE CROSS/BLUE SHIELD | Source: Ambulatory Visit | Attending: Radiation Oncology | Admitting: Radiation Oncology

## 2016-05-20 DIAGNOSIS — Z510 Encounter for antineoplastic radiation therapy: Secondary | ICD-10-CM | POA: Diagnosis not present

## 2016-05-21 ENCOUNTER — Ambulatory Visit
Admission: RE | Admit: 2016-05-21 | Discharge: 2016-05-21 | Disposition: A | Discharge status: Home / Self Care | Payer: BLUE CROSS/BLUE SHIELD | Source: Ambulatory Visit | Attending: Radiation Oncology | Admitting: Radiation Oncology

## 2016-05-21 ENCOUNTER — Encounter: Payer: Self-pay | Admitting: Radiation Oncology

## 2016-05-21 VITALS — BP 123/90 | HR 70 | Temp 98.4°F | Ht 64.0 in | Wt 191.2 lb

## 2016-05-21 DIAGNOSIS — C539 Malignant neoplasm of cervix uteri, unspecified: Secondary | ICD-10-CM

## 2016-05-21 DIAGNOSIS — Z510 Encounter for antineoplastic radiation therapy: Secondary | ICD-10-CM | POA: Diagnosis not present

## 2016-05-21 NOTE — Progress Notes (Signed)
Caitlin Flowers has completed 10 fractions to her pelvis.  She denies having pain.  She reports having fatigue.  She reports having burning in her abdomen and diarrhea after she eats.  She reports having 4-5 episodes of diarrhea today.  Advised her to start taking Imodium and gave her directions of how to use it.  She reports urinating small amounts frequently.  She reports it is hard to start her stream but denies having dysuria.  She denies having any vaginal bleeding.  BP 123/90 (BP Location: Left Arm, Patient Position: Sitting)   Pulse 70   Temp 98.4 F (36.9 C) (Oral)   Ht 5\' 4"  (1.626 m)   Wt 191 lb 3.2 oz (86.7 kg)   LMP 02/06/2016   SpO2 100%   BMI 32.82 kg/m    Wt Readings from Last 3 Encounters:  05/21/16 191 lb 3.2 oz (86.7 kg)  05/14/16 191 lb 3.2 oz (86.7 kg)  05/07/16 190 lb (86.2 kg)

## 2016-05-21 NOTE — Progress Notes (Signed)
  Radiation Oncology         [phone]) [phone] ________________________________  Name: Caitlin Flowers MRN: [id number]  Date: 05/21/2016  DOB: Jan 02, 1971  Weekly Radiation Therapy Management    ICD-9-CM ICD-10-CM   1. Primary cervical cancer (HCC) 180.9 C53.9      Current Dose: 18 Gy     Planned Dose:  45+ Gy  Narrative . . . . . . . . The patient presents for routine under treatment assessment.                                   The patient has completed 10 fractions to her Pelvis. She denies having pain. She reports fatigue. She reports burning in her abdomen and diarrhea after she eats. She reports having 4-5 episodes of diarrhea today. The patient reports urinating small amounts frequently. She reports it is hard to start her stream. She denies dysuria. She denies any vaginal bleeding. The patient reports she needs a note for her workplace explaining that she cannot return to work until April 1. She is accompanied by a translator today.                                  Set-up films were reviewed.                                 The chart was checked. Physical Findings. . .  height is 5\' 4"  (1.626 m) and weight is 191 lb 3.2 oz (86.7 kg). Her oral temperature is 98.4 F (36.9 C). Her blood pressure is 123/90 and her pulse is 70. Her oxygen saturation is 100%.   Lungs are clear to auscultation bilaterally. Heart has regular rate and rhythm. Abdomen soft, non-tender, normal bowel sounds. Impression . . . . . . . The patient is tolerating radiation. Plan . . . . . . . . . . . . Continue treatment as planned. I advised the patient to begin taking Imodium as needed for diarrhea. We also discussed the importance of drinking plenty of water, as her urinary symptoms may be related to dehydration. I checked with Elmo Putt, RN about the patient's paperwork, which has been completed. The patient will be provided with another copy for her records. ________________________________   Blair Promise, PhD,  MD  This document serves as a record of services personally performed by Gery Pray, MD. It was created on his behalf by Maryla Morrow, a trained medical scribe. The creation of this record is based on the scribe's personal observations and the provider's statements to them. This document has been checked and approved by the attending provider.

## 2016-05-22 ENCOUNTER — Ambulatory Visit
Admission: RE | Admit: 2016-05-22 | Discharge: 2016-05-22 | Disposition: A | Discharge status: Home / Self Care | Payer: BLUE CROSS/BLUE SHIELD | Source: Ambulatory Visit | Attending: Radiation Oncology | Admitting: Radiation Oncology

## 2016-05-22 DIAGNOSIS — Z510 Encounter for antineoplastic radiation therapy: Secondary | ICD-10-CM | POA: Diagnosis not present

## 2016-05-23 ENCOUNTER — Ambulatory Visit
Admission: RE | Admit: 2016-05-23 | Discharge: 2016-05-23 | Disposition: A | Discharge status: Home / Self Care | Payer: BLUE CROSS/BLUE SHIELD | Source: Ambulatory Visit | Attending: Radiation Oncology | Admitting: Radiation Oncology

## 2016-05-23 DIAGNOSIS — Z510 Encounter for antineoplastic radiation therapy: Secondary | ICD-10-CM | POA: Diagnosis not present

## 2016-05-24 ENCOUNTER — Ambulatory Visit
Admission: RE | Admit: 2016-05-24 | Discharge: 2016-05-24 | Disposition: A | Discharge status: Home / Self Care | Payer: BLUE CROSS/BLUE SHIELD | Source: Ambulatory Visit | Attending: Radiation Oncology | Admitting: Radiation Oncology

## 2016-05-24 DIAGNOSIS — Z510 Encounter for antineoplastic radiation therapy: Secondary | ICD-10-CM | POA: Diagnosis not present

## 2016-05-27 ENCOUNTER — Ambulatory Visit
Admission: RE | Admit: 2016-05-27 | Discharge: 2016-05-27 | Disposition: A | Discharge status: Home / Self Care | Payer: BLUE CROSS/BLUE SHIELD | Source: Ambulatory Visit | Attending: Radiation Oncology | Admitting: Radiation Oncology

## 2016-05-27 DIAGNOSIS — Z510 Encounter for antineoplastic radiation therapy: Secondary | ICD-10-CM | POA: Diagnosis not present

## 2016-05-28 ENCOUNTER — Ambulatory Visit
Admission: RE | Admit: 2016-05-28 | Discharge: 2016-05-28 | Disposition: A | Discharge status: Home / Self Care | Payer: BLUE CROSS/BLUE SHIELD | Source: Ambulatory Visit | Attending: Radiation Oncology | Admitting: Radiation Oncology

## 2016-05-28 ENCOUNTER — Encounter: Payer: Self-pay | Admitting: Radiation Oncology

## 2016-05-28 VITALS — BP 130/80 | HR 76 | Temp 98.4°F | Resp 18 | Ht 64.0 in | Wt 193.0 lb

## 2016-05-28 DIAGNOSIS — Z510 Encounter for antineoplastic radiation therapy: Secondary | ICD-10-CM | POA: Diagnosis not present

## 2016-05-28 DIAGNOSIS — C539 Malignant neoplasm of cervix uteri, unspecified: Secondary | ICD-10-CM

## 2016-05-28 NOTE — Progress Notes (Signed)
Caitlin Flowers has completed 15 fractions to her pelvis.  She denies having pain.  She reports having fatigue in the mornings and afternoons  She reports  burning to  in her abdomen has improved and diarrhea after she eats is ongoing.  She reports having 3 episodes of diarrhea today. Taking Imodium as needed.  She reports urinating normal.  She denies having any vaginal bleeding. Wt Readings from Last 3 Encounters:  05/28/16 193 lb (87.5 kg)  05/21/16 191 lb 3.2 oz (86.7 kg)  05/14/16 191 lb 3.2 oz (86.7 kg)  BP 130/80   Pulse 76   Temp 98.4 F (36.9 C) (Oral)   Resp 18   Ht 5\' 4"  (1.626 m)   Wt 193 lb (87.5 kg)   LMP 02/06/2016   SpO2 100%   BMI 33.13 kg/m

## 2016-05-28 NOTE — Progress Notes (Signed)
  Radiation Oncology         (336) [phone] ________________________________  Name: Caitlin Flowers MRN: [id number]  Date: 05/28/2016  DOB: 04-Oct-1970  Weekly Radiation Therapy Management    ICD-9-CM ICD-10-CM   1. Primary cervical cancer (HCC) 180.9 C53.9      Current Dose: 27 Gy     Planned Dose:  45+ Gy  Narrative . . . . . . . . The patient presents for routine under treatment assessment.                                   Tyja Epstein has completed 15 fractions to her pelvis.  She denies having pain, but she reports burning in her abdomen that has improved. She has ongoing diarrhea after she eats. She reports having three episodes of diarrhea today and is taking Imodium as needed. She reports normal urination urinating.  She denies vaginal bleeding. She reports fatigue in the morning and afternoon. She denies nausea at this time.                                  Set-up films were reviewed.                                 The chart was checked. Physical Findings. . .  height is 5\' 4"  (1.626 m) and weight is 193 lb (87.5 kg). Her oral temperature is 98.4 F (36.9 C). Her blood pressure is 130/80 and her pulse is 76. Her respiration is 18 and oxygen saturation is 100%.   Lungs are clear to auscultation bilaterally. Heart has regular rate and rhythm. Abdomen soft, non-tender, normal bowel sounds. Impression . . . . . . . The patient is tolerating radiation. Plan . . . . . . . . . . . . Continue treatment as planned. ________________________________   Blair Promise, PhD, MD  This document serves as a record of services personally performed by Gery Pray, MD. It was created on his behalf by Darcus Austin, a trained medical scribe. The creation of this record is based on the scribe's personal observations and the provider's statements to them. This document has been checked and approved by the attending provider.

## 2016-05-29 ENCOUNTER — Ambulatory Visit
Admission: RE | Admit: 2016-05-29 | Discharge: 2016-05-29 | Disposition: A | Discharge status: Home / Self Care | Payer: BLUE CROSS/BLUE SHIELD | Source: Ambulatory Visit | Attending: Radiation Oncology | Admitting: Radiation Oncology

## 2016-05-29 ENCOUNTER — Encounter: Payer: Self-pay | Admitting: Radiation Oncology

## 2016-05-29 VITALS — BP 126/97 | HR 73 | Temp 98.0°F | Ht 64.0 in | Wt 192.0 lb

## 2016-05-29 DIAGNOSIS — C539 Malignant neoplasm of cervix uteri, unspecified: Secondary | ICD-10-CM

## 2016-05-29 DIAGNOSIS — Z510 Encounter for antineoplastic radiation therapy: Secondary | ICD-10-CM | POA: Diagnosis not present

## 2016-05-29 DIAGNOSIS — C531 Malignant neoplasm of exocervix: Secondary | ICD-10-CM

## 2016-05-29 MED ORDER — ONDANSETRON HCL 4 MG PO TABS
4.0000 mg | ORAL_TABLET | Freq: Once | ORAL | Status: AC
  Administered 2016-05-29: 4 mg via ORAL
  Filled 2016-05-29: qty 1

## 2016-05-29 MED ORDER — HYDROCORTISONE ACETATE 25 MG RE SUPP: 25.0000 mg | Freq: Two times a day (BID) | RECTAL | 0 refills | Status: DC

## 2016-05-29 MED ORDER — ONDANSETRON HCL 8 MG PO TABS: 8.0000 mg | ORAL_TABLET | Freq: Three times a day (TID) | ORAL | 0 refills | Status: DC | PRN

## 2016-05-29 NOTE — Progress Notes (Signed)
Joliyah has completed 16 fractions to her pelvis.  She reports having abdominal cramping after she eats.  She reports having nausea and said food smells are bothering her.  She also reports vomiting 2 times last night.  She is having diarrhea 3 times a day and is taking 2 tablets of Imodium per day.  She also reports having a headache today.  Advised her to try taking Imodium before she eats.  BP (!) 126/97 (BP Location: Left Arm, Patient Position: Sitting)   Pulse 73   Temp 98 F (36.7 C) (Oral)   Ht 5\' 4"  (1.626 m)   Wt 192 lb (87.1 kg)   LMP 02/06/2016   SpO2 100%   BMI 32.96 kg/m    Wt Readings from Last 3 Encounters:  05/29/16 192 lb (87.1 kg)  05/28/16 193 lb (87.5 kg)  05/21/16 191 lb 3.2 oz (86.7 kg)

## 2016-05-29 NOTE — Progress Notes (Signed)
  Radiation Oncology         (336) [phone] ________________________________  Name: Caitlin Flowers MRN: [id number]  Date: 05/29/2016  DOB: 04-24-1970  Weekly Radiation Therapy Management    ICD-9-CM ICD-10-CM   1. 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 Primary cervical cancer (HCC) 180.9 C53.9      Current Dose: 28.8 Gy     Planned Dose:  45+ Gy  Narrative . . . . . . . . The patient presents for routine under treatment assessment.                                   The patient has completed 16 fractions to her pelvis. She reports abdominal cramping after she eats. She reports nausea and said food smells are bothering her. She reports emesis twice last night. She is having diarrhea 3 times a day and is taking 2 tablets of Imodium per day. She reports a sore bottom. She also reports having a headache today. The nurse advised her to try taking Imodium before she eats. The patient denies being around those with the flu. She denies fever or chills.                                  Set-up films were reviewed.                                 The chart was checked. Physical Findings. . .  height is 5\' 4"  (1.626 m) and weight is 192 lb (87.1 kg). Her oral temperature is 98 F (36.7 C). Her blood pressure is 126/97 (abnormal) and her pulse is 73. Her oxygen saturation is 100%.   Lungs are clear to auscultation bilaterally. Heart has regular rate and rhythm. Abdomen soft, the patient localized pain in the left lower quadrant, no rebound.  Impression . . . . . . . The patient is tolerating radiation, but has abdominal cramping, nausea, and emesis. Plan . . . . . . . . . . . . Continue treatment as planned. I advised the patient to increase Imodium to twice in the morning and twice in the afternoon. I will prescribe nausea Zofran 8 mg and suppositories for the patient's rectal proctitis symptoms. We will also give one Zofran in clinic at this time.  The  patient's preferred pharmacy is the [facility] at [address], [place], [place] [address]. ________________________________   Blair Promise, PhD, MD  This document serves as a record of services personally performed by Gery Pray, MD. It was created on his behalf by Darcus Austin, a trained medical scribe. The creation of this record is based on the scribe's personal observations and the provider's statements to them. This document has been checked and approved by the attending provider.

## 2016-05-30 ENCOUNTER — Ambulatory Visit
Admission: RE | Admit: 2016-05-30 | Discharge: 2016-05-30 | Disposition: A | Discharge status: Home / Self Care | Payer: BLUE CROSS/BLUE SHIELD | Source: Ambulatory Visit | Attending: Radiation Oncology | Admitting: Radiation Oncology

## 2016-05-30 DIAGNOSIS — Z510 Encounter for antineoplastic radiation therapy: Secondary | ICD-10-CM | POA: Diagnosis not present

## 2016-05-31 ENCOUNTER — Ambulatory Visit
Admission: RE | Admit: 2016-05-31 | Discharge: 2016-05-31 | Disposition: A | Discharge status: Home / Self Care | Payer: BLUE CROSS/BLUE SHIELD | Source: Ambulatory Visit | Attending: Radiation Oncology | Admitting: Radiation Oncology

## 2016-05-31 DIAGNOSIS — Z510 Encounter for antineoplastic radiation therapy: Secondary | ICD-10-CM | POA: Diagnosis not present

## 2016-06-03 ENCOUNTER — Ambulatory Visit
Admission: RE | Admit: 2016-06-03 | Discharge: 2016-06-03 | Disposition: A | Discharge status: Home / Self Care | Payer: BLUE CROSS/BLUE SHIELD | Source: Ambulatory Visit | Attending: Radiation Oncology | Admitting: Radiation Oncology

## 2016-06-03 DIAGNOSIS — Z510 Encounter for antineoplastic radiation therapy: Secondary | ICD-10-CM | POA: Diagnosis not present

## 2016-06-04 ENCOUNTER — Ambulatory Visit
Admission: RE | Admit: 2016-06-04 | Discharge: 2016-06-04 | Disposition: A | Discharge status: Home / Self Care | Payer: BLUE CROSS/BLUE SHIELD | Source: Ambulatory Visit | Attending: Radiation Oncology | Admitting: Radiation Oncology

## 2016-06-04 ENCOUNTER — Encounter: Payer: Self-pay | Admitting: Radiation Oncology

## 2016-06-04 VITALS — BP 134/73 | HR 68 | Temp 98.3°F | Ht 64.0 in | Wt 193.8 lb

## 2016-06-04 DIAGNOSIS — C531 Malignant neoplasm of exocervix: Secondary | ICD-10-CM

## 2016-06-04 DIAGNOSIS — Z510 Encounter for antineoplastic radiation therapy: Secondary | ICD-10-CM | POA: Diagnosis not present

## 2016-06-04 DIAGNOSIS — Z923 Personal history of irradiation: Secondary | ICD-10-CM | POA: Insufficient documentation

## 2016-06-04 DIAGNOSIS — C539 Malignant neoplasm of cervix uteri, unspecified: Secondary | ICD-10-CM | POA: Diagnosis not present

## 2016-06-04 LAB — BASIC METABOLIC PANEL
ANION GAP: 9 meq/L (ref 3–11)
BUN: 8.8 mg/dL (ref 7.0–26.0)
CALCIUM: 9.7 mg/dL (ref 8.4–10.4)
CO2: 26 mEq/L (ref 22–29)
CREATININE: 0.7 mg/dL (ref 0.6–1.1)
Chloride: 105 mEq/L (ref 98–109)
EGFR: >90 mL/min/{1.73_m2} (ref >90)
Glucose: 94 mg/dl (ref 70–140)
Potassium: 4.8 mEq/L (ref 3.5–5.1)
SODIUM: 140 meq/L (ref 136–145)

## 2016-06-04 LAB — CBC WITH DIFFERENTIAL/PLATELET
BASO%: 0.4 % (ref 0.0–2.0)
Basophils Absolute: 0 10*3/uL (ref 0.0–0.1)
EOS ABS: 0.4 10*3/uL (ref 0.0–0.5)
EOS%: 10.2 % — ABNORMAL HIGH (ref 0.0–7.0)
HCT: 38.3 % (ref 34.8–46.6)
HGB: 12.7 g/dL (ref 11.6–15.9)
LYMPH%: 10.9 % — AB (ref 14.0–49.7)
MCH: 30.2 pg (ref 25.1–34.0)
MCHC: 33.3 g/dL (ref 31.5–36.0)
MCV: 90.9 fL (ref 79.5–101.0)
MONO#: 0.4 10*3/uL (ref 0.1–0.9)
MONO%: 9.4 % (ref 0.0–14.0)
NEUT#: 2.7 10*3/uL (ref 1.5–6.5)
NEUT%: 69.1 % (ref 38.4–76.8)
PLATELETS: 179 10*3/uL (ref 145–400)
RBC: 4.21 10*6/uL (ref 3.70–5.45)
RDW: 13.1 % (ref 11.2–14.5)
WBC: 3.9 10*3/uL (ref 3.9–10.3)
lymph#: 0.4 10*3/uL — ABNORMAL LOW (ref 0.9–3.3)

## 2016-06-04 LAB — MAGNESIUM: MAGNESIUM: 2.1 mg/dL (ref 1.5–2.5)

## 2016-06-04 MED ORDER — LORAZEPAM 0.5 MG PO TABS
0.5000 mg | ORAL_TABLET | Freq: Once | ORAL | Status: AC
  Administered 2016-06-04: 0.5 mg via ORAL
  Filled 2016-06-04: qty 1

## 2016-06-04 NOTE — Progress Notes (Signed)
Caitlin Flowers has completed 20 fractions to her pelvis.  She reports having stomach cramps after she eats.  She reports she is not having diarrhea now.  She is taking zofran but says it is not helping.  She reports feeling dizzy when she walks around and says "the floor spins."  She reports having fatigue and feels weak. Orthostatic vitals taken: bp sitting 134/73, hr 68, bp standing 139/93, hr 75.  BP 134/73   Pulse 68   Temp 98.3 F (36.8 C) (Oral)   Ht 5\' 4"  (1.626 m)   Wt 193 lb 12.8 oz (87.9 kg)   LMP 02/06/2016   SpO2 100%   BMI 33.27 kg/m    Wt Readings from Last 3 Encounters:  06/04/16 193 lb 12.8 oz (87.9 kg)  05/29/16 192 lb (87.1 kg)  05/28/16 193 lb (87.5 kg)

## 2016-06-04 NOTE — Progress Notes (Signed)
  Radiation Oncology         [phone]) [phone] ________________________________  Name: Caitlin Flowers MRN: [id number]  Date: 06/04/2016  DOB: [birth year]  Weekly Radiation Therapy Management    ICD-9-CM ICD-10-CM   1. Primary cervical cancer (HCC) 180.9 C53.9   2. 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 . . . . . . . The patient presents for routine under treatment assessment.                                   Caitlin Flowers has completed 20 fractions to her pelvis. She reports abdominal cramping after she eats. She reports she is not having diarrhea now. She is taking zofran, but says it is not helping with her nausea.  She reports feeling dizzy when she walks around and says "the floor spins."  She reports having fatigue and feels weak.                                  Set-up films were reviewed.                                 The chart was checked. Physical Findings. . .  height is 5\' 4"  (1.626 m) and weight is 193 lb 12.8 oz (87.9 kg). Her oral temperature is 98.3 F (36.8 C). Her blood pressure is 134/73 and her pulse is 68. Her oxygen saturation is 100%.   Lungs are clear to auscultation bilaterally. Heart has regular rate and rhythm. Abdomen soft, normal bowel sounds, tender with palpation, no rebound or guarding. Impression . . . . . . . The patient is tolerating radiation, but has abdominal cramping and nausea. Plan . . . . . . . . . . . . Continue treatment as planned. I advised the patient to eat light, bland meals. We will give 0.5 mg sublingual Ativan in the clinic. I will order CBC, BMET, and magnesium testing.  The patient's preferred pharmacy is the [facility] at [address], [place], [place] [address]. ________________________________   Blair Promise, PhD, MD  This document serves as a record of services  personally performed by Gery Pray, MD. It was created on his behalf by Darcus Austin, a trained medical scribe. The creation of this record is based on the scribe's personal observations and the provider's statements to them. This document has been checked and approved by the attending provider.

## 2016-06-04 NOTE — Progress Notes (Signed)
Verbal order to give patient 0.5mg  ativan SL, asked patient name and dob as identification, gave 0.5mg  ativan sl, then offered water, , patient c/o nausea, encouraged patient after her lab  This am at 1000am, to go home and rest, ativan can make you sleepy, also for nausea 10:04 AM

## 2016-06-05 ENCOUNTER — Ambulatory Visit
Admission: RE | Admit: 2016-06-05 | Discharge: 2016-06-05 | Disposition: A | Discharge status: Home / Self Care | Payer: BLUE CROSS/BLUE SHIELD | Source: Ambulatory Visit | Attending: Radiation Oncology | Admitting: Radiation Oncology

## 2016-06-05 ENCOUNTER — Encounter: Payer: Self-pay | Admitting: Oncology

## 2016-06-05 DIAGNOSIS — Z510 Encounter for antineoplastic radiation therapy: Secondary | ICD-10-CM | POA: Diagnosis not present

## 2016-06-06 ENCOUNTER — Ambulatory Visit
Admission: RE | Admit: 2016-06-06 | Discharge: 2016-06-06 | Disposition: A | Discharge status: Home / Self Care | Payer: BLUE CROSS/BLUE SHIELD | Source: Ambulatory Visit | Attending: Radiation Oncology | Admitting: Radiation Oncology

## 2016-06-06 DIAGNOSIS — Z510 Encounter for antineoplastic radiation therapy: Secondary | ICD-10-CM | POA: Diagnosis not present

## 2016-06-07 ENCOUNTER — Ambulatory Visit: Payer: BLUE CROSS/BLUE SHIELD

## 2016-06-07 ENCOUNTER — Ambulatory Visit
Admission: RE | Admit: 2016-06-07 | Discharge: 2016-06-07 | Disposition: A | Discharge status: Home / Self Care | Payer: BLUE CROSS/BLUE SHIELD | Source: Ambulatory Visit | Attending: Radiation Oncology | Admitting: Radiation Oncology

## 2016-06-07 DIAGNOSIS — Z510 Encounter for antineoplastic radiation therapy: Secondary | ICD-10-CM | POA: Diagnosis not present

## 2016-06-10 ENCOUNTER — Ambulatory Visit: Payer: BLUE CROSS/BLUE SHIELD

## 2016-06-10 ENCOUNTER — Ambulatory Visit
Admission: RE | Admit: 2016-06-10 | Discharge: 2016-06-10 | Disposition: A | Discharge status: Home / Self Care | Payer: BLUE CROSS/BLUE SHIELD | Source: Ambulatory Visit | Attending: Radiation Oncology | Admitting: Radiation Oncology

## 2016-06-10 DIAGNOSIS — Z510 Encounter for antineoplastic radiation therapy: Secondary | ICD-10-CM | POA: Diagnosis not present

## 2016-06-11 ENCOUNTER — Ambulatory Visit: Payer: BLUE CROSS/BLUE SHIELD

## 2016-06-11 ENCOUNTER — Ambulatory Visit
Admission: RE | Admit: 2016-06-11 | Discharge: 2016-06-11 | Disposition: A | Discharge status: Home / Self Care | Payer: BLUE CROSS/BLUE SHIELD | Source: Ambulatory Visit | Attending: Radiation Oncology | Admitting: Radiation Oncology

## 2016-06-11 ENCOUNTER — Encounter: Payer: Self-pay | Admitting: Radiation Oncology

## 2016-06-11 VITALS — BP 123/82 | HR 74 | Temp 98.1°F | Ht 64.0 in | Wt 194.0 lb

## 2016-06-11 DIAGNOSIS — C539 Malignant neoplasm of cervix uteri, unspecified: Secondary | ICD-10-CM

## 2016-06-11 DIAGNOSIS — Z510 Encounter for antineoplastic radiation therapy: Secondary | ICD-10-CM | POA: Diagnosis not present

## 2016-06-11 NOTE — Progress Notes (Signed)
  Radiation Oncology         [phone]) [phone] ________________________________  Name: Caitlin Flowers MRN: [id number]  Date: 06/11/2016  DOB: [birth year]  Weekly Radiation Therapy Management    ICD-9-CM ICD-10-CM   1. Primary cervical cancer (HCC) 180.9 C53.9      Current Dose: 45 Gy     Planned Dose:  45+ Gy  Narrative . . . . . . . . The patient presents for routine under treatment assessment.                                  Carys Simmerman has completed treatment with 25 fractions.  She denies having pain, nausea, or abdominal cramping. She reports having dizziness and a headache, but says it is better than last week. It happens when she stands for long periods of time. She is taking Imodium twice a day and denies having diarrhea. She denies having any vaginal bleeding. She reports having some itching over her incisions occasionally and fatigue.                                  Set-up films were reviewed.                                 The chart was checked. Physical Findings. . .  height is 5\' 4"  (1.626 m) and weight is 194 lb (88 kg). Her oral temperature is 98.1 F (36.7 C). Her blood pressure is 123/82 and her pulse is 74. Her oxygen saturation is 100%.   Lungs are clear to auscultation bilaterally. Heart has regular rate and rhythm. Abdomen soft, normal bowel sounds, tender with palpation, no rebound or guarding. Impression . . . . . . . The patient tolerated external beam radiation with abdominal cramping, nausea, and diarrhea. Plan . . . . . . . . . . . . The patient completed external bream radiation. She will return on 06/20/16 for a pelvic examination, CT simulation, and begin HDR brachytherapy treatment directed to the vaginal cuff. I explained the process of HDR brachytherapy. I advised the patient to drink more water for her dizziness.  The patient's preferred pharmacy is the [facility] at [address], [place], [place] [address]. ________________________________   Blair Promise,  PhD, MD  This document serves as a record of services personally performed by Gery Pray, MD. It was created on his behalf by Darcus Austin, a trained medical scribe. The creation of this record is based on the scribe's personal observations and the provider's statements to them. This document has been checked and approved by the attending provider.

## 2016-06-11 NOTE — Progress Notes (Signed)
Caitlin Flowers has completed treatment with 25 fractions.  She denies having pain or nausea.  She is reporting having dizziness and a headache but says it is better than last week.  She reports it happens when she stands for long periods.  She is taking Imodium twice a day and denies having diarrhea.  She denies having any vaginal bleeding.  She reports having some itching over her incisions occasionally.  She reports having fatigue.  BP 123/82 (BP Location: Left Arm, Patient Position: Sitting)   Pulse 74   Temp 98.1 F (36.7 C) (Oral)   Ht 5\' 4"  (1.626 m)   Wt 194 lb (88 kg)   LMP 02/06/2016   SpO2 100%   BMI 33.30 kg/m    Wt Readings from Last 3 Encounters:  06/11/16 194 lb (88 kg)  06/04/16 193 lb 12.8 oz (87.9 kg)  05/29/16 192 lb (87.1 kg)

## 2016-06-12 ENCOUNTER — Ambulatory Visit: Payer: BLUE CROSS/BLUE SHIELD

## 2016-06-19 ENCOUNTER — Telehealth: Payer: Self-pay | Admitting: *Deleted

## 2016-06-19 NOTE — Telephone Encounter (Signed)
CALLED PATIENT TO REMIND OF HDR [facility] CASE FOR 06-20-16, SPOKE WITH PATIENT AND SHE IS AWARE OF THIS CASE

## 2016-06-20 ENCOUNTER — Ambulatory Visit
Admission: RE | Admit: 2016-06-20 | Discharge: 2016-06-20 | Disposition: A | Discharge status: Home / Self Care | Payer: BLUE CROSS/BLUE SHIELD | Source: Ambulatory Visit | Attending: Radiation Oncology | Admitting: Radiation Oncology

## 2016-06-20 ENCOUNTER — Encounter: Payer: Self-pay | Admitting: Radiation Oncology

## 2016-06-20 ENCOUNTER — Ambulatory Visit: Payer: BLUE CROSS/BLUE SHIELD

## 2016-06-20 DIAGNOSIS — Z923 Personal history of irradiation: Secondary | ICD-10-CM | POA: Diagnosis not present

## 2016-06-20 DIAGNOSIS — C539 Malignant neoplasm of cervix uteri, unspecified: Secondary | ICD-10-CM | POA: Insufficient documentation

## 2016-06-20 DIAGNOSIS — Z887 Allergy status to serum and vaccine status: Secondary | ICD-10-CM | POA: Diagnosis not present

## 2016-06-20 DIAGNOSIS — Z888 Allergy status to other drugs, medicaments and biological substances status: Secondary | ICD-10-CM | POA: Insufficient documentation

## 2016-06-20 DIAGNOSIS — Z79899 Other long term (current) drug therapy: Secondary | ICD-10-CM | POA: Insufficient documentation

## 2016-06-20 NOTE — Progress Notes (Signed)
Caitlin Flowers is here for follow up/HDR.  She denies having pain.  She reports having urinary frequency and has to strain to start urinating.  She said she is only going small amounts at a time.  She denies having nausea, diarrhea or vaginal bleeding.  She reports having occasional fatigue.  BP 138/79 (BP Location: Left Arm, Patient Position: Sitting)   Pulse 94   Temp 98.9 F (37.2 C) (Oral)   Ht 5\' 4"  (1.626 m)   Wt 194 lb 6.4 oz (88.2 kg)   LMP 02/06/2016   SpO2 100%   BMI 33.37 kg/m    Wt Readings from Last 3 Encounters:  06/20/16 194 lb 6.4 oz (88.2 kg)  06/11/16 194 lb (88 kg)  06/04/16 193 lb 12.8 oz (87.9 kg)

## 2016-06-20 NOTE — Progress Notes (Signed)
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 She is accompanied by a translator today. She denies pain at this time. She reports urinary frequency and must strain to begin urine stream. She reports she is only emptying her bladder small amounts at a time. She denies nausea, diarrhea, or vaginal bleeding. The patient reports occasional fatigue.   ALLERGIES: is allergic to influenza vaccines.  Meds: Current Outpatient Prescriptions  Medication Sig Dispense Refill  . hydrocortisone (ANUSOL-HC) 25 MG suppository Place 1 suppository (25 mg total) rectally 2 (two) times daily. (Patient not taking: Reported on 06/20/2016) 12 suppository 0  . ibuprofen (ADVIL,MOTRIN) 800 MG tablet Take 1 tablet (800 mg total) by mouth every 8 (eight) hours as needed (mild pain). (Patient not taking: Reported on 06/11/2016) 30 tablet 0  . loperamide (IMODIUM) 2 MG capsule Take by mouth as needed for diarrhea or loose stools.    . ondansetron (ZOFRAN) 8 MG tablet Take 1 tablet (8 mg total) by mouth every 8 (eight) hours as needed for nausea or vomiting. (Patient not taking: Reported on 06/11/2016) 20 tablet 0  . oxyCODONE-acetaminophen (PERCOCET/ROXICET) 5-325 MG tablet Take 1-2 tablets by mouth every 4 (four) hours as needed (moderate to severe pain (when tolerating fluids)). (Patient not taking: Reported on 04/25/2016) 30 tablet 0  . senna (SENOKOT) 8.6 MG TABS tablet Take 1 tablet (8.6 mg total) by mouth at bedtime. Use while taking percocet or if constipated (Patient not taking: Reported on 05/28/2016) 120 each 0  . zolpidem (AMBIEN) 5 MG  tablet Take 1 tablet (5 mg total) by mouth at bedtime as needed for sleep. (Patient not taking: Reported on 05/28/2016) 30 tablet 0   No current facility-administered medications for this encounter.     Physical Findings: The patient is in no acute distress. Patient is alert and oriented.  height is 5\' 4"  (1.626 m) and weight is 194 lb 6.4 oz (88.2 kg). Her oral temperature is 98.9 F (37.2 C). Her blood pressure is 138/79 and her pulse is 94. Her oxygen saturation is 100%.   No palpable cervical, supraclavicular or axillary lymphoadenopathy. The heart has a regular rate and rhythm. The lungs are clear to auscultation. Abdomen soft and non-tender.  On pelvic examination the external genitalia were unremarkable. A speculum exam was performed. Vaginal cuff intact, no mucosal lesions. On bimanual exam there were no pelvic masses appreciated.   Patient was fitted for a vaginal cylinder. The patient will be treated with a 3 cm diameter cylinder with a treatment length of 3 cm. This distended the vaginal vault without undue discomfort. The patient tolerated the procedure well.  Lab Findings: Lab Results  Component Value Date   WBC 3.9 06/04/2016   HGB 12.7 06/04/2016   HCT 38.3 06/04/2016   MCV 90.9 06/04/2016   PLT 179 06/04/2016    Radiographic Findings: No results found.  Impression: This is a 46 y.o. 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  The patient was successfully fitted for a vaginal cylinder. The patient is appropriate to begin vaginal brachytherapy.    Plan: The patient will proceed with CT  simulation and vaginal brachytherapy today. She will receive 18 gray in 3 fractions with prescription to the mucosal surface. A 3 cm down cylinder will be used for treatment. A 3 cm treatment length.   _______________________________   Blair Promise, PhD, MD  This document serves as a record of services personally performed by Gery Pray, MD. It was created on his behalf by Maryla Morrow, a trained medical scribe. The creation of this record is based on the scribe's personal observations and the provider's statements to them. This document has been checked and approved by the attending provider.

## 2016-06-20 NOTE — Progress Notes (Signed)
  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 She will be treated with a 3 cm diameter segmented cylinder. This conforms to her anatomy without undue discomfort.  Vaginal brachytherapy procedure node: The patient was brought to the [place] suite. Identity was confirmed. All relevant records and images related to the planned course of therapy were reviewed. The patient freely provided informed written consent to proceed with treatment after reviewing the details related to the planned course of therapy. The consent form was witnessed and verified by the simulation staff. Then, the patient was set-up in a stable reproducible supine position for radiation therapy. Pelvic exam revealed the vaginal cuff to be intact. The patient's custom vaginal cylinder was placed in the proximal vagina. This was affixed to the CT/MR stabilization plate to prevent slippage. Patient tolerated the placement well.  Verification simulation note:  A fiducial marker was placed within the vaginal cylinder. An AP and lateral film was then obtained through the pelvis area. This documented accurate position of the vaginal cylinder for treatment.  HDR BRACHYTHERAPY TREATMENT  The remote afterloading device was affixed to the vaginal cylinder by catheter. Patient then proceeded to undergo her first high-dose-rate treatment directed at the proximal vagina. The patient was prescribed a dose of 30 gray to be delivered to the mucosal surface. Treatment length was 3 cm. Patient was treated with 1 channel using 7 dwell positions. Treatment time was 270.3 seconds. Iridium 192 was the high-dose-rate source  for treatment. The patient tolerated the treatment well. After completion of her therapy, a radiation survey was performed documenting return of the iridium source into the GammaMed safe.   PLAN: The patient will return for her second HDR treatment next week.  -----------------------------------  Blair Promise, PhD, MD  This document serves as a record of services personally performed by Gery Pray, MD. It was created on his behalf by Maryla Morrow, a trained medical scribe. The creation of this record is based on the scribe's personal observations and the provider's statements to them. This document has been checked and approved by the attending provider.

## 2016-06-20 NOTE — Progress Notes (Signed)
  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  Identity was confirmed.  All relevant records and images related to the planned course of therapy were reviewed.  The patient freely provided informed written consent to proceed with treatment after reviewing the details related to the planned course of therapy. The consent form was witnessed and verified by the simulation staff.  Then, the patient was set-up in a stable reproducible  supine position for radiation therapy.  CT images were obtained.  Surface markings were placed.  The CT images were loaded into the planning software.  Then the target and avoidance structures were contoured.  Treatment planning then occurred.  The radiation prescription was entered and confirmed.   I have requested : Brachytherapy Isodose Plan and Dosimetry Calculations to plan the radiation distribution.    PLAN:  The patient will receive 18 Gy in 3 fractions.  Patient will be treated with a 3 cm diameter cylinder. Treatment length will be 3 cm. Prescription will be 6 gray to the mucosal surface.  -----------------------------------  Blair Promise, PhD, MD  This document serves as a record of services personally performed by Gery Pray, MD. It was created on his behalf by Maryla Morrow, a trained medical scribe. The creation of this record is based on the scribe's personal observations and the provider's statements to them. This document has been checked and approved by the attending provider.

## 2016-06-26 ENCOUNTER — Telehealth: Payer: Self-pay | Admitting: *Deleted

## 2016-06-26 NOTE — Telephone Encounter (Signed)
CALLED PATIENT TO REMIND OF HDR [place] 06-27-16 @ 12 PM, SPOKE WITH PATIENT AND SHE IS AWARE OF THIS [place].

## 2016-06-27 ENCOUNTER — Encounter: Payer: Self-pay | Admitting: Radiation Oncology

## 2016-06-27 ENCOUNTER — Ambulatory Visit
Admission: RE | Admit: 2016-06-27 | Discharge: 2016-06-27 | Disposition: A | Discharge status: Home / Self Care | Payer: BLUE CROSS/BLUE SHIELD | Source: Ambulatory Visit | Attending: Radiation Oncology | Admitting: Radiation Oncology

## 2016-06-27 VITALS — BP 126/85 | HR 72 | Temp 98.1°F | Resp 18

## 2016-06-27 DIAGNOSIS — C539 Malignant neoplasm of cervix uteri, unspecified: Secondary | ICD-10-CM | POA: Insufficient documentation

## 2016-06-27 DIAGNOSIS — C531 Malignant neoplasm of exocervix: Secondary | ICD-10-CM

## 2016-06-27 LAB — URINALYSIS, MICROSCOPIC - CHCC
BILIRUBIN (URINE): NEGATIVE
Glucose: NEGATIVE mg/dL
KETONES: NEGATIVE mg/dL
Nitrite: NEGATIVE
Protein: NEGATIVE mg/dL
SPECIFIC GRAVITY, URINE: 1.005 (ref 1.003–1.035)
UROBILINOGEN UR: 0.2 mg/dL (ref 0.2–1)
pH: 6 (ref 4.6–8.0)

## 2016-06-27 NOTE — Progress Notes (Signed)
  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 She will be treated with a 3 cm diameter segmented cylinder. This conforms to her anatomy without undue discomfort.  Vaginal brachytherapy procedure node: The patient was brought to the [place] suite. Identity was confirmed. All relevant records and images related to the planned course of therapy were reviewed. The patient freely provided informed written consent to proceed with treatment after reviewing the details related to the planned course of therapy. The consent form was witnessed and verified by the simulation staff. Then, the patient was set-up in a stable reproducible supine position for radiation therapy. Pelvic exam revealed the vaginal cuff to be intact. The patient's custom vaginal cylinder was placed in the proximal vagina. This was affixed to the CT/MR stabilization plate to prevent slippage. Patient tolerated the placement well.  Verification simulation note:  A fiducial marker was placed within the vaginal cylinder. An AP and lateral film was then obtained through the pelvis area. This documented accurate position of the vaginal cylinder for treatment.  HDR BRACHYTHERAPY TREATMENT  The remote afterloading device was affixed to the vaginal cylinder by catheter. Patient then proceeded to undergo her second high-dose-rate treatment directed at the proximal vagina. The patient was prescribed a dose of 30 gray to be delivered to the mucosal surface. Treatment length was 3 cm. Patient was treated with 1 channel using 7 dwell positions. Treatment time was 288.7 seconds. Iridium 192 was the high-dose-rate  source for treatment. The patient tolerated the treatment well. After completion of her therapy, a radiation survey was performed documenting return of the iridium source into the GammaMed safe.   PLAN: The patient will return for her third HDR treatment next week.  -----------------------------------  Blair Promise, PhD, MD  This document serves as a record of services personally performed by Gery Pray, MD. It was created on his behalf by Bethann Humble, a trained medical scribe. The creation of this record is based on the scribe's personal observations and the provider's statements to them. This document has been checked and approved by the attending provider.

## 2016-06-27 NOTE — Progress Notes (Addendum)
Gave wipex 3 and specimen cup to the patient to collect a urine sample per verbal order  Dr. Sondra Come Patient gav verbal understanding of collecting clean catch urine, vitals obtained  Wnl, patient denies being hot any more, drinking water so she can give sample  1:30 PM BP 126/85 (BP Location: Left Arm, Patient Position: Sitting, Cuff Size: Normal)   Pulse 72   Temp 98.1 F (36.7 C) (Oral)   Resp 18   LMP 02/06/2016   Wt Readings from Last 3 Encounters:  06/20/16 194 lb 6.4 oz (88.2 kg)  06/11/16 194 lb (88 kg)  06/04/16 193 lb 12.8 oz (87.9 kg)

## 2016-06-28 LAB — URINE CULTURE: ORGANISM ID, BACTERIA: NO GROWTH

## 2016-07-03 ENCOUNTER — Telehealth: Payer: Self-pay | Admitting: *Deleted

## 2016-07-03 NOTE — Telephone Encounter (Signed)
Called patient to remind of HDR Tx. For 07-04-16 @ 1 pm, spoke with patient and she is aware of this appt.

## 2016-07-04 ENCOUNTER — Ambulatory Visit
Admission: RE | Admit: 2016-07-04 | Discharge: 2016-07-04 | Disposition: A | Discharge status: Home / Self Care | Payer: BLUE CROSS/BLUE SHIELD | Source: Ambulatory Visit | Attending: Radiation Oncology | Admitting: Radiation Oncology

## 2016-07-04 DIAGNOSIS — C539 Malignant neoplasm of cervix uteri, unspecified: Secondary | ICD-10-CM

## 2016-07-04 NOTE — Progress Notes (Signed)
  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 She will be treated with a 3 cm diameter segmented cylinder. This conforms to her anatomy without undue discomfort.  Vaginal brachytherapy procedure node: The patient was brought to the [place] suite. Identity was confirmed. All relevant records and images related to the planned course of therapy were reviewed. The patient freely provided informed written consent to proceed with treatment after reviewing the details related to the planned course of therapy. The consent form was witnessed and verified by the simulation staff. Then, the patient was set-up in a stable reproducible supine position for radiation therapy. Pelvic exam revealed the vaginal cuff to be intact. The patient's custom vaginal cylinder was placed in the proximal vagina. This was affixed to the CT/MR stabilization plate to prevent slippage. Patient tolerated the placement well.  Verification simulation note:  A fiducial marker was placed within the vaginal cylinder. An AP and lateral film was then obtained through the pelvis area. This documented accurate position of the vaginal cylinder for treatment.  HDR BRACHYTHERAPY TREATMENT  The remote afterloading device was affixed to the vaginal cylinder by catheter. Patient then proceeded to undergo her third high-dose-rate treatment directed at the proximal vagina. The patient was prescribed a dose of 6 gray to be delivered to the mucosal surface. Treatment length was 3 cm. Patient was treated with 1 channel using 7 dwell positions. Treatment time was 308.4 seconds. Iridium 192 was the high-dose-rate source  for treatment. The patient tolerated the treatment well. After completion of her therapy, a radiation survey was performed documenting return of the iridium source into the GammaMed safe.   PLAN: The patient will return for follow up in one month.  -----------------------------------  Blair Promise, PhD, MD  This document serves as a record of services personally performed by Gery Pray, MD. It was created on his behalf by Bethann Humble, a trained medical scribe. The creation of this record is based on the scribe's personal observations and the provider's statements to them. This document has been checked and approved by the attending provider.

## 2016-07-08 ENCOUNTER — Encounter: Payer: Self-pay | Admitting: Radiation Oncology

## 2016-07-08 NOTE — Progress Notes (Signed)
  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  During treatment, she reported improving dizziness and headaches as well as fatigue. She also noted occasional itching over her incisions. Some diarrhea. Some nausea.  Plan: The patient has completed radiation treatment. The patient will return to radiation oncology clinic for routine followup in one month. I advised them to call or return sooner if they have any questions or concerns related to their recovery or treatment.  -----------------------------------  Blair Promise, PhD, MD  This document serves as a record of services personally performed by Gery Pray, MD. It was created on his behalf by Bethann Humble, a trained medical scribe. The creation of this record is based on the scribe's personal observations and the provider's statements to them. This document has been checked and approved by the attending provider.

## 2016-07-24 ENCOUNTER — Encounter: Payer: Self-pay | Admitting: Radiation Oncology

## 2016-07-24 NOTE — Progress Notes (Signed)
Faxed to [employer] with [employer] claim information.

## 2016-08-15 ENCOUNTER — Encounter: Payer: Self-pay | Admitting: Radiation Oncology

## 2016-08-15 ENCOUNTER — Ambulatory Visit: Payer: BLUE CROSS/BLUE SHIELD | Admitting: Radiation Oncology

## 2016-08-15 ENCOUNTER — Telehealth: Payer: Self-pay | Admitting: *Deleted

## 2016-08-15 ENCOUNTER — Ambulatory Visit
Admission: RE | Admit: 2016-08-15 | Discharge: 2016-08-15 | Disposition: A | Discharge status: Home / Self Care | Payer: BLUE CROSS/BLUE SHIELD | Source: Ambulatory Visit | Attending: Radiation Oncology | Admitting: Radiation Oncology

## 2016-08-15 VITALS — BP 129/69 | HR 88 | Temp 98.7°F | Ht 64.0 in | Wt 205.2 lb

## 2016-08-15 DIAGNOSIS — C539 Malignant neoplasm of cervix uteri, unspecified: Secondary | ICD-10-CM

## 2016-08-15 NOTE — Progress Notes (Signed)
Caitlin Flowers is here for follow up with an interpreter.  She denies having pain, urinary issues, diarrhea or vaginal bleeding/discharge.  She reports having a good energy level.  She has been given size M and S+ vaginal dilators.  BP 129/69 (BP Location: Left Arm, Patient Position: Sitting)   Pulse 88   Temp 98.7 F (37.1 C) (Oral)   Ht 5\' 4"  (1.626 m)   Wt 205 lb 3.2 oz (93.1 kg)   LMP 02/06/2016   SpO2 100%   BMI 35.22 kg/m    Wt Readings from Last 3 Encounters:  08/15/16 205 lb 3.2 oz (93.1 kg)  06/20/16 194 lb 6.4 oz (88.2 kg)  06/11/16 194 lb (88 kg)

## 2016-08-15 NOTE — Progress Notes (Signed)
  Home Care Instructions for the Insertion and Care of Your Vaginal Dilator  Why Do I Need a Vaginal Dilator?  Internal radiation therapy may cause scar tissue to form at the top of your vagina (vaginal cuff).  This may make vaginal examinations difficult in the future. You can prevent scar tissue from forming by using a vaginal dilator (a smooth plastic rod), and/or by having regular sexual intercourse.  If not using the dilator you should be having intercourse two or three times a week.  If you are unable to have intercourse, you should use your vaginal dilator.  You may have some spotting or bleeding from your dilator or intercourse the first few times. You may also have some discomfort. If discomfort occurs with intercourse, you and your partner may need to stop for a while and try again later.  How to Use Your Vaginal Dilator  - Wash the dilator with soap and water before and after each use. - Check the dilator to be sure it is smooth. Do not use the dilator if you find any roughspots. - Coat the dilator with K-Y Jelly, Astroglide, or Replens. Do not use Vaseline, baby oil, or other oil based lubricants. They are not water-soluble and can be irritating to the tissues in the vagina. - Lie on your back with your knees bent and legs apart. - Insert the rounded end of the dilator into your vagina as far as it will go without causing pain or discomfort. - Close your knees and slowly straighten your legs. - Keep the dilator in your vagina for about 10 to 15 minutes.  Please use it 3 times a week. (For example, Monday, Wednesday and Friday). [facility] your knees, open your legs, and gently remove the dilator. - Gently cleanse the skin around the vaginal opening. - Wash the dilator after each use. -  It is important that you use the dilator routinely until instructed otherwise by your doctor.

## 2016-08-15 NOTE — Telephone Encounter (Signed)
Per Dr. Clabe Seal nurse patient need a 2 mth follow up appt. I have scheduled the appt for June 18th at 3:15pm, arrive at [place] will call the patient

## 2016-08-15 NOTE — Progress Notes (Signed)
Radiation Oncology         [phone] ________________________________  Name: Caitlin Flowers MRN: [id number]  Date: 08/15/2016  DOB: 1970-05-14  Follow-Up Visit Note  CC: No PCP Per Patient  Everitt Amber, MD    ICD-9-CM ICD-10-CM   1. 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 The patient denies pain, urinary issues, diarrhea, or vaginal bleeding/discharge. She reports a good energy level. Patient presents with an interpreter.                             ALLERGIES:  is allergic to influenza vaccines.  Meds: Current Outpatient Prescriptions  Medication Sig Dispense Refill  . hydrocortisone (ANUSOL-HC) 25 MG suppository Place 1 suppository (25 mg total) rectally 2 (two) times daily. (Patient not taking: Reported on 06/20/2016) 12 suppository 0  . ibuprofen (ADVIL,MOTRIN) 800 MG tablet Take 1 tablet (800 mg total) by mouth every 8 (eight) hours as needed (mild pain). (Patient not taking: Reported on 06/11/2016) 30 tablet 0  . loperamide (IMODIUM) 2 MG capsule Take by mouth as needed for diarrhea or loose stools.    . ondansetron (ZOFRAN) 8 MG tablet Take 1 tablet (8 mg total) by mouth every 8 (eight) hours as needed for nausea or vomiting. (Patient not taking: Reported on 06/11/2016) 20 tablet 0  . oxyCODONE-acetaminophen (PERCOCET/ROXICET) 5-325 MG tablet Take 1-2 tablets by mouth every 4 (four) hours as needed (moderate to severe pain (when tolerating fluids)). (Patient not taking: Reported on 04/25/2016) 30 tablet 0  . senna (SENOKOT) 8.6 MG TABS tablet Take 1 tablet (8.6 mg total) by mouth at bedtime. Use while taking percocet or if constipated (Patient  not taking: Reported on 05/28/2016) 120 each 0  . zolpidem (AMBIEN) 5 MG tablet Take 1 tablet (5 mg total) by mouth at bedtime as needed for sleep. (Patient not taking: Reported on 05/28/2016) 30 tablet 0   No current facility-administered medications for this encounter.     Physical Findings: The patient is in no acute distress. Patient is alert and oriented.  height is 5\' 4"  (1.626 m) and weight is 205 lb 3.2 oz (93.1 kg). Her oral temperature is 98.7 F (37.1 C). Her blood pressure is 129/69 and her pulse is 88. Her oxygen saturation is 100%. .  No significant changes. Lungs are clear to auscultation bilaterally. Heart has regular rate and rhythm. No palpable cervical, supraclavicular, or axillary adenopathy. Abdomen soft, non-tender, normal bowel sounds. Pelvic exam deferred in light of recent treatment completion.   Lab Findings: Lab Results  Component Value Date   WBC 3.9 06/04/2016   HGB 12.7 06/04/2016   HCT 38.3 06/04/2016   MCV 90.9 06/04/2016   PLT 179 06/04/2016    Radiographic Findings: No results found.  Impression:  The patient is recovering from the effects of radiation. The patient is doing well clinically since completion of her radiotherapy. The patient was given a vaginal dilator and she was provided with instructions for it's use.  Plan:  The patient will follow up in radiation oncology in 5 months. Follow up with Dr. Denman George in 2 months.  ____________________________________  This document serves as a record of services personally performed by Gery Pray, MD. It was created on his behalf by Bethann Humble, a trained medical scribe. The creation of this record is based on the scribe's personal observations and the provider's statements to them. This document has been checked and approved by the attending provider.

## 2016-08-15 NOTE — Telephone Encounter (Signed)
CALLED PATIENT TO INFORM OF FU APPT. WITH DR. Denman George ON 10-07-16 - ARRIVAL TIME - 3 PM, SPOKE WITH PATIENT AND SHE IS AWARE OF THIS APPT.

## 2016-08-29 ENCOUNTER — Ambulatory Visit: Payer: BLUE CROSS/BLUE SHIELD | Admitting: Radiation Oncology

## 2016-10-07 ENCOUNTER — Encounter: Payer: Self-pay | Admitting: Gynecologic Oncology

## 2016-10-07 ENCOUNTER — Ambulatory Visit: Payer: BLUE CROSS/BLUE SHIELD | Attending: Gynecologic Oncology | Admitting: Gynecologic Oncology

## 2016-10-07 VITALS — BP 134/85 | HR 66 | Temp 97.9°F | Resp 18 | Wt 200.7 lb

## 2016-10-07 DIAGNOSIS — Z90722 Acquired absence of ovaries, bilateral: Secondary | ICD-10-CM | POA: Diagnosis not present

## 2016-10-07 DIAGNOSIS — D649 Anemia, unspecified: Secondary | ICD-10-CM | POA: Insufficient documentation

## 2016-10-07 DIAGNOSIS — Z9889 Other specified postprocedural states: Secondary | ICD-10-CM | POA: Diagnosis not present

## 2016-10-07 DIAGNOSIS — C539 Malignant neoplasm of cervix uteri, unspecified: Secondary | ICD-10-CM | POA: Diagnosis present

## 2016-10-07 DIAGNOSIS — Z887 Allergy status to serum and vaccine status: Secondary | ICD-10-CM | POA: Insufficient documentation

## 2016-10-07 DIAGNOSIS — Z8541 Personal history of malignant neoplasm of cervix uteri: Secondary | ICD-10-CM

## 2016-10-07 DIAGNOSIS — R600 Localized edema: Secondary | ICD-10-CM | POA: Diagnosis not present

## 2016-10-07 DIAGNOSIS — Z8249 Family history of ischemic heart disease and other diseases of the circulatory system: Secondary | ICD-10-CM | POA: Insufficient documentation

## 2016-10-07 DIAGNOSIS — I890 Lymphedema, not elsewhere classified: Secondary | ICD-10-CM | POA: Diagnosis not present

## 2016-10-07 DIAGNOSIS — Z90710 Acquired absence of both cervix and uterus: Secondary | ICD-10-CM | POA: Insufficient documentation

## 2016-10-07 DIAGNOSIS — C531 Malignant neoplasm of exocervix: Secondary | ICD-10-CM

## 2016-10-07 NOTE — Progress Notes (Signed)
Consult Note: Gyn-Onc  Consult was requested by Dr. Roselie Awkward for the evaluation of Caitlin Flowers 46 y.o. female  CC:  Chief Complaint  Patient presents with  . Cervical Cancer    follow up    Assessment/Plan:  Caitlin Flowers  is a 46 y.o.  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 Intermediate risk factors. S/p adjuvant whole pelvic RT completed March, 2018.  No evidence of disease.  I will see her back in at 3 monthly intervals until March, 2020 after which I will see her back 6 monthly. We will perform annual paps with HPV surveillance for assessment of new HPV related disease.  HPI: Caitlin Flowers is a 46 year old [place] woman (speaks Amharic) who is seen in consultation at the request of Dr Roselie Awkward for high grade SCC of the cervix.  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 She was recommended to return for colposcopy but failed to do so.  In August, 2017 she began noticing edema of her left lower extremity. She was evaluated in the ED with a doppler of the lower extremity which was negative for DVT and a CT abdo/pelvis which showed no hydronephrosis or hydroureter, small uterine fibroids, but no lesions to explain her LE edema.  She followed up with Dr Roselie Awkward on 01/10/16 and proceed with colposcopy.   At the time of colposcopy a visible lesion was seen on the anterior lip of the cervix. It was biopsied and returned as high grade squamous cell carcinoma. Endocervical curette revealed CIN III.  The patient is otherwise healthy. SHe has had no prior abdominal surgeries, though has had a hysteroscopic resection of a myoma in the past. She denies HIV exposure and is a non-smoker.  She denies intermenstrual bleeding and abnormal discharge.  On 02/07/16 PET/CT was performed and showed no evidence of extracervical spread (including no lymphadenopathy) or explanation for  pre-existing LE lymphedema.  This was originally scheduled for October, 2017, however, the week before surgery, the patient cancelled her surgery based on the information she was provided by her family members.  I informed the patient that delays in treatment of her cancer could upstage her disease or make cure less likely.   On 03/19/16 she underwent robotic assisted radical hysterectomy, bilateral salpingectomy and lymphadenectomy and ovarian pexy. Final pathology showed a 3.5cm moderately differentiated squamous lesion with 12 of 86mm stromal invasion and LVSI present. Lymph nodes were negative.  She was admitted postop for colonic ileus which resolved with bowel rest.   She was received adjuvant external beam radiation and vaginal brachytherapy (45 Gy to the pelvis in 25 fractions and 18 Gy to the cuff in 3 fractions) due to her intermediate risk factors on pathology. Duration of therapy was 05/07/16 until 07/04/16.  Interval Hx:  Since therapy she has done well with no complaints. Her left LE lymphedema is stable postop.   Current Meds:  Outpatient Encounter Prescriptions as of 10/07/2016  Medication Sig  . hydrocortisone (ANUSOL-HC) 25 MG suppository Place 1 suppository (25 mg total) rectally 2 (two) times daily. (Patient not taking: Reported on 06/20/2016)  . ibuprofen (ADVIL,MOTRIN) 800 MG tablet Take 1 tablet (800 mg total) by mouth every 8 (eight) hours as needed (mild pain). (Patient not taking: Reported on 06/11/2016)  . loperamide (IMODIUM) 2 MG capsule Take by mouth as needed for diarrhea or loose stools.  . ondansetron (ZOFRAN) 8 MG  tablet Take 1 tablet (8 mg total) by mouth every 8 (eight) hours as needed for nausea or vomiting. (Patient not taking: Reported on 06/11/2016)  . oxyCODONE-acetaminophen (PERCOCET/ROXICET) 5-325 MG tablet Take 1-2 tablets by mouth every 4 (four) hours as needed (moderate to severe pain (when tolerating fluids)). (Patient not taking: Reported on 04/25/2016)  .  senna (SENOKOT) 8.6 MG TABS tablet Take 1 tablet (8.6 mg total) by mouth at bedtime. Use while taking percocet or if constipated (Patient not taking: Reported on 05/28/2016)  . zolpidem (AMBIEN) 5 MG tablet Take 1 tablet (5 mg total) by mouth at bedtime as needed for sleep. (Patient not taking: Reported on 05/28/2016)   No facility-administered encounter medications on file as of 10/07/2016.     Allergy:  Allergies  Allergen Reactions  . Influenza Vaccines Shortness Of Breath    Flu like symptoms     Social Hx:   Social History   Social History  . Marital status: Legally Separated    Spouse name: N/A  . Number of children: 2  . Years of education: N/A   Occupational History  . [employer]    Social History Main Topics  . Smoking status: Never Smoker  . Smokeless tobacco: Never Used  . Alcohol use No  . Drug use: No  . Sexual activity: Yes    Birth control/ protection: None   Other Topics Concern  . Not on file   Social History Narrative  . No narrative on file    Past Surgical Hx:  Past Surgical History:  Procedure Laterality Date  . DILATION AND CURETTAGE OF UTERUS  02/11/12   Hysteroscopy with versapoint resection/myomectomy/  . INCISION AND DRAINAGE OF WOUND Right 01/13/2013   Procedure: IRRIGATION AND DEBRIDEMENT OF NAIL BED ABLATION AND FULL THICKNESS SKIN GRAFT ;  Surgeon: Schuyler Amor, MD;  Location: [facility];  Service: Orthopedics;  Laterality: Right;  . ROBOTIC ASSISTED LAP VAGINAL HYSTERECTOMY N/A 03/19/2016   Procedure: XI ROBOTIC ASSISTED LAPAROSCOPIC RADICAL HYSTERECTOMY;  Surgeon: Everitt Amber, MD;  Location: [facility];  Service: Gynecology;  Laterality: N/A;  . ROBOTIC ASSISTED SALPINGO OOPHERECTOMY Bilateral 03/19/2016   Procedure: XI ROBOTIC ASSISTED SALPINGO OOPHORECTOMY;  Surgeon: Everitt Amber, MD;  Location: [facility];  Service: Gynecology;  Laterality: Bilateral;  . SENTINEL NODE BIOPSY Bilateral 03/19/2016   Procedure: PELVIC LYMPH  NODE BIOPSY;  Surgeon: Everitt Amber, MD;  Location: [facility];  Service: Gynecology;  Laterality: Bilateral;  . WISDOM TOOTH EXTRACTION      Past Medical Hx:  Past Medical History:  Diagnosis Date  . Anemia   . Cervical cancer ([place])   . Fibroid (bleeding) (uterine) 2012  . SVD (spontaneous vaginal delivery)    x 2  . Wears glasses    to read    Past Gynecological History:  SVD x 2 Patient's last menstrual period was 02/06/2016.  Family Hx:  Family History  Problem Relation Age of Onset  . Hypertension Mother     Review of Systems:  Constitutional  Feels well,    ENT Normal appearing ears and nares bilaterally Skin/Breast  No rash, sores, jaundice, itching, dryness Cardiovascular  No chest pain, shortness of breath, or edema  Pulmonary  No cough or wheeze.  Gastro Intestinal  No nausea, vomitting, or diarrhoea. No bright red blood per rectum, no abdominal pain, change in bowel movement, or constipation.  Genito Urinary  No frequency, urgency, dysuria, no bleeding Musculo Skeletal  + LLE edema 3+ (stable,  preceeded surgery) Neurologic  No weakness, numbness, change in gait,  Psychology  No depression, anxiety, insomnia.   Vitals:  Blood pressure 134/85, pulse 66, temperature 97.9 F (36.6 C), resp. rate 18, weight 200 lb 11.2 oz (91 kg), last menstrual period 02/06/2016, SpO2 99 %.  Physical Exam: WD in NAD Neck  Supple NROM, without any enlargements.  Lymph Node Survey No cervical supraclavicular or inguinal adenopathy however there is fullness in the left inguinal region. Cardiovascular  Pulse normal rate, regularity and rhythm. S1 and S2 normal.  Lungs  Clear to auscultation bilateraly, without wheezes/crackles/rhonchi. Good air movement.  Skin  No rash/lesions/breakdown  Psychiatry  Alert and oriented to person, place, and time  Abdomen  Normoactive bowel sounds, abdomen soft, non-tender and nonobese without evidence of hernia. Well healed abdominal  incisions. Back No CVA tenderness Genito Urinary  Vulva/vagina: normal external genitalia. No lesions, blood, masses. Surgically absent cervix and uterus.  Rectal  Good tone, no masses no cul de sac nodularity.  Extremities  +1 left LE edema   [name], MD  10/07/2016, 11:23 AM

## 2016-10-07 NOTE — Patient Instructions (Signed)
Please notify Dr Denman George at phone number [phone] if you notice vaginal bleeding, new pelvic or abdominal pains, bloating, feeling full easy, or a change in bladder or bowel function.    Your follow-up with Dr Denman George will be 3 months after you see Dr Sondra Come.

## 2016-11-07 ENCOUNTER — Encounter: Payer: Self-pay | Admitting: Radiation Oncology

## 2016-11-07 NOTE — Progress Notes (Signed)
Paperwork (unum) received 11/07/16, given to nurse 11/07/16 °

## 2016-11-14 ENCOUNTER — Encounter: Payer: Self-pay | Admitting: Radiation Oncology

## 2016-11-14 NOTE — Progress Notes (Signed)
Paperwork (unum) received from doctor 725/18, faxed to [phone], attn Ludger Nutting, confirmation received 11/14/16. Copy mailed to patient 11/14/16

## 2017-01-14 ENCOUNTER — Encounter: Payer: Self-pay | Admitting: Oncology

## 2017-01-16 ENCOUNTER — Ambulatory Visit
Admission: RE | Admit: 2017-01-16 | Discharge: 2017-01-16 | Disposition: A | Discharge status: Home / Self Care | Payer: Self-pay | Source: Ambulatory Visit | Attending: Radiation Oncology | Admitting: Radiation Oncology

## 2017-01-16 VITALS — BP 139/80 | HR 63 | Temp 98.2°F | Ht 64.0 in | Wt 195.6 lb

## 2017-01-16 DIAGNOSIS — Z923 Personal history of irradiation: Secondary | ICD-10-CM | POA: Insufficient documentation

## 2017-01-16 DIAGNOSIS — C539 Malignant neoplasm of cervix uteri, unspecified: Secondary | ICD-10-CM | POA: Insufficient documentation

## 2017-01-16 DIAGNOSIS — C531 Malignant neoplasm of exocervix: Secondary | ICD-10-CM

## 2017-01-16 DIAGNOSIS — Z887 Allergy status to serum and vaccine status: Secondary | ICD-10-CM | POA: Insufficient documentation

## 2017-01-16 NOTE — Progress Notes (Signed)
Caitlin Flowers is here with han intrepreter for follow up.  She denies having pain, urinary/bowel issues or vaginal bleeding/discharge.  She reports having a good appetite and energy level.  She is working full time.  She is using a vaginal dilator.  BP 139/80 (BP Location: Left Arm, Patient Position: Sitting)   Pulse 63   Temp 98.2 F (36.8 C) (Oral)   Ht 5\' 4"  (1.626 m)   Wt 195 lb 9.6 oz (88.7 kg)   LMP 02/06/2016   SpO2 100%   BMI 33.57 kg/m    Wt Readings from Last 3 Encounters:  01/16/17 195 lb 9.6 oz (88.7 kg)  10/07/16 200 lb 11.2 oz (91 kg)  08/15/16 205 lb 3.2 oz (93.1 kg)

## 2017-01-16 NOTE — Progress Notes (Signed)
Radiation Oncology         [phone] ________________________________  Name: Caitlin Flowers MRN: [id number]  Date: 01/16/2017  DOB: 1971-01-28  Follow-Up Visit Note  CC: Patient, No Pcp Per  Everitt Amber, MD    ICD-10-CM   1. Primary cervical cancer ([place]) C53.9   2. Malignant neoplasm of exocervix ([facility]) C53.1     Diagnosis:   46 y.o. woman with Stage IB1 squamous cell carcinoma of the cervix (moderately differentiated)  Interval Since Last Radiation:  6 months,  post op, close surgical margin, deeply invasive tumor  05/07/16-06/11/16: 45 Gy to the pelvis in 25 fractions 06/20/16-07/04/16: 18 Gy to the vaginal cuff in 3 fractions  Narrative: Hayslee is here with an intrepreter for follow up.  She denies having pain, urinary/bowel issues or vaginal bleeding/discharge.  She reports having a good appetite and energy level.  She is working full time.  She is using a vaginal dilator two times a week.       ALLERGIES:  is allergic to influenza vaccines.  Meds: Current Outpatient Prescriptions  Medication Sig Dispense Refill  . hydrocortisone (ANUSOL-HC) 25 MG suppository Place 1 suppository (25 mg total) rectally 2 (two) times daily. (Patient not taking: Reported on 06/20/2016) 12 suppository 0  . ibuprofen (ADVIL,MOTRIN) 800 MG tablet Take 1 tablet (800 mg total) by mouth every 8 (eight) hours as needed (mild pain). (Patient not taking: Reported on 06/11/2016) 30 tablet 0  . loperamide (IMODIUM) 2 MG capsule Take by mouth as needed for diarrhea or loose stools.    . ondansetron (ZOFRAN) 8 MG tablet Take 1 tablet (8 mg total) by mouth every 8 (eight) hours as needed for nausea or vomiting. (Patient not taking: Reported on 06/11/2016) 20 tablet 0  . oxyCODONE-acetaminophen (PERCOCET/ROXICET) 5-325 MG tablet Take 1-2 tablets by mouth every 4 (four) hours as needed (moderate to severe pain (when tolerating fluids)). (Patient not taking: Reported on 04/25/2016) 30 tablet 0  . senna (SENOKOT) 8.6 MG  TABS tablet Take 1 tablet (8.6 mg total) by mouth at bedtime. Use while taking percocet or if constipated (Patient not taking: Reported on 05/28/2016) 120 each 0  . zolpidem (AMBIEN) 5 MG tablet Take 1 tablet (5 mg total) by mouth at bedtime as needed for sleep. (Patient not taking: Reported on 05/28/2016) 30 tablet 0   No current facility-administered medications for this encounter.     Physical Findings: The patient is in no acute distress. Patient is alert and oriented.  height is 5\' 4"  (1.626 m) and weight is 195 lb 9.6 oz (88.7 kg). Her oral temperature is 98.2 F (36.8 C). Her blood pressure is 139/80 and her pulse is 63. Her oxygen saturation is 100%. .    Lungs are clear to auscultation bilaterally. Heart has regular rate and rhythm. No palpable cervical, supraclavicular, or axillary adenopathy. Abdomen soft, non-tender, normal bowel sounds.  On pelvic examination the external genitalia were unremarkable. A speculum exam was performed. There are no mucosal lesions noted in the vaginal vault. A Pap smear was obtained of the proximal vagina. On bimanual and rectovaginal examination there were no pelvic masses appreciated.  Patient may have a non- thrombosed hemorrhoid, which made rectal exam very uncomfortable.  Sphincter tone normal  Lab Findings: Lab Results  Component Value Date   WBC 3.9 06/04/2016   HGB 12.7 06/04/2016   HCT 38.3 06/04/2016   MCV 90.9 06/04/2016   PLT 179 06/04/2016    Radiographic Findings: No results found.  Impression: There are no signs of reoccurrence in the clinical exam. Patent does not seem to be symptomatic with questionable hemorrhoid, no bleeding or pain with bowel movements. .  Plan:  Patient will have a routine follow up in March 2019. She will see Dr. Denman George in December 2018. Annual pap smears planned by Dr. Denman George. ____________________________________  Blair Promise, PhD, MD  This document serves as a record of services personally performed  by Gery Pray MD. It was created on his behalf by Delton Coombes, a trained medical scribe. The creation of this record is based on the scribe's personal observations and the provider's statements to them. This document has been checked and approved by the attending provider.

## 2017-03-08 IMAGING — DX DG ABDOMEN ACUTE W/ 1V CHEST
4 series · 4 of 4 positions shown · non-contrast
Comparison: Chest radiograph March 04, 2016; CT abdomen and
pelvis December 11, 2015

CLINICAL DATA: Abdominal pain and vomiting. Two days post
hysterectomy

EXAM:
DG ABDOMEN ACUTE W/ 1V CHEST

[chest pa]
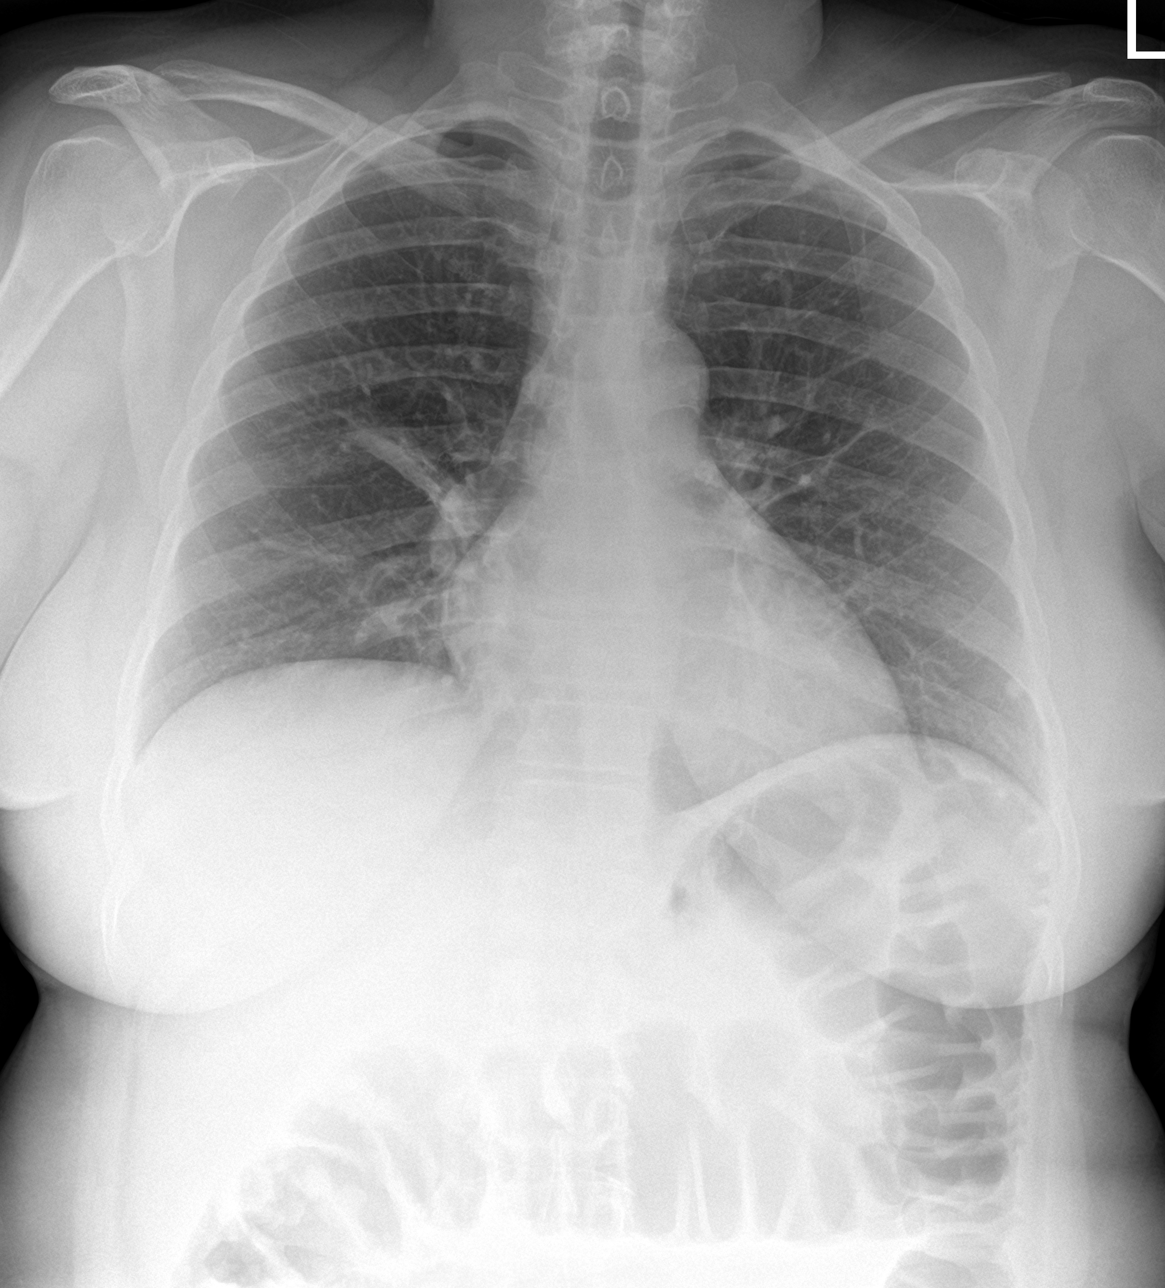

[abdomen erect]
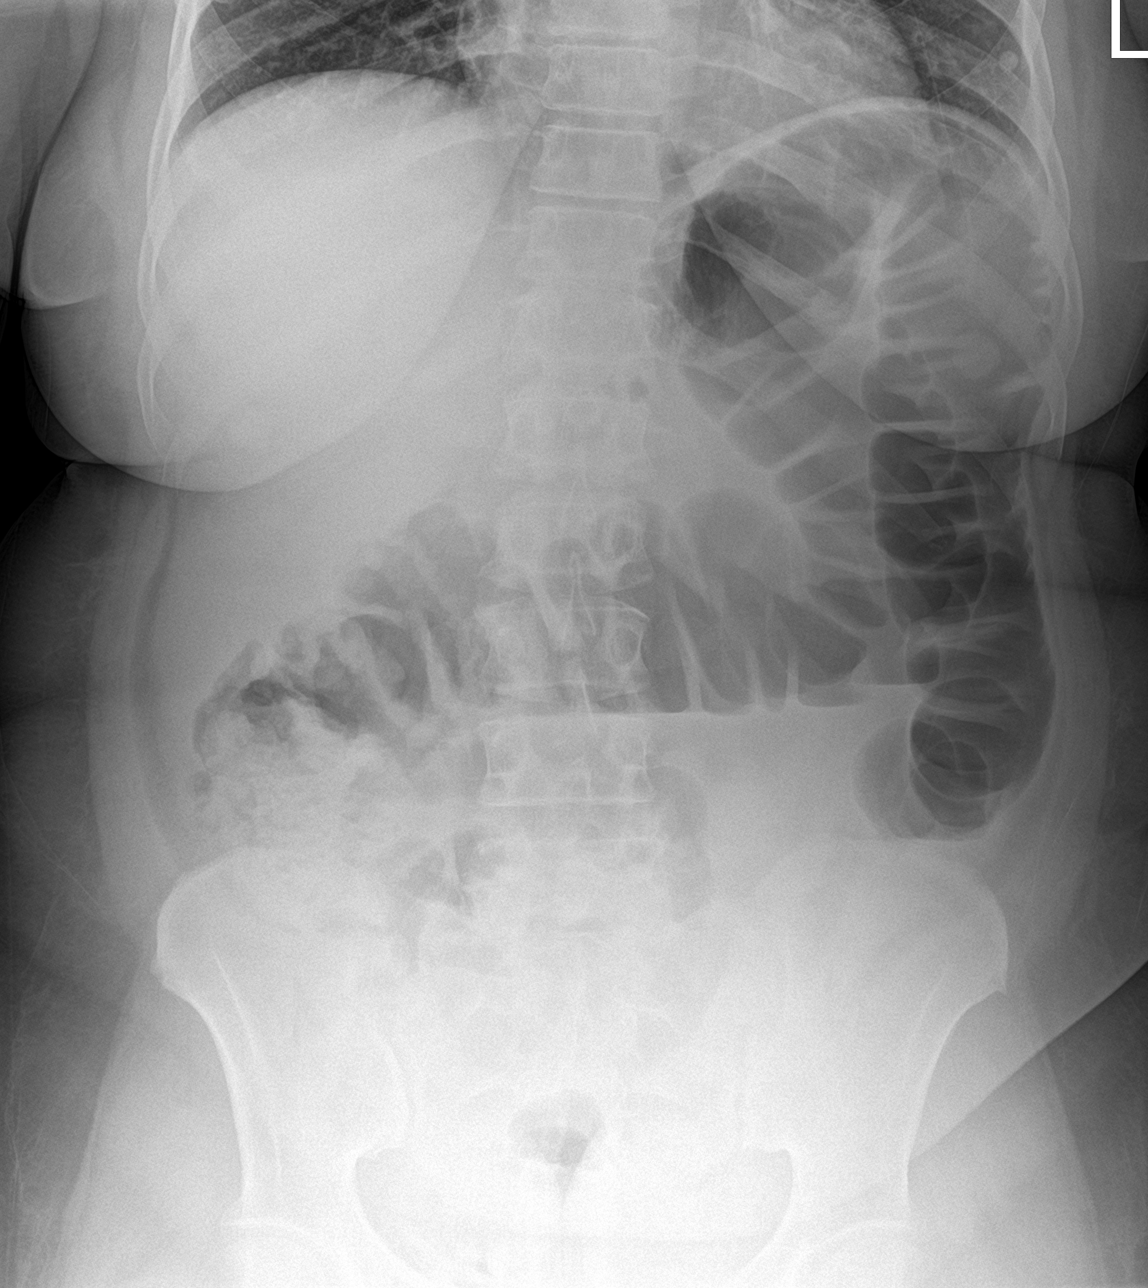

[abdomen supine (1 of 2)]
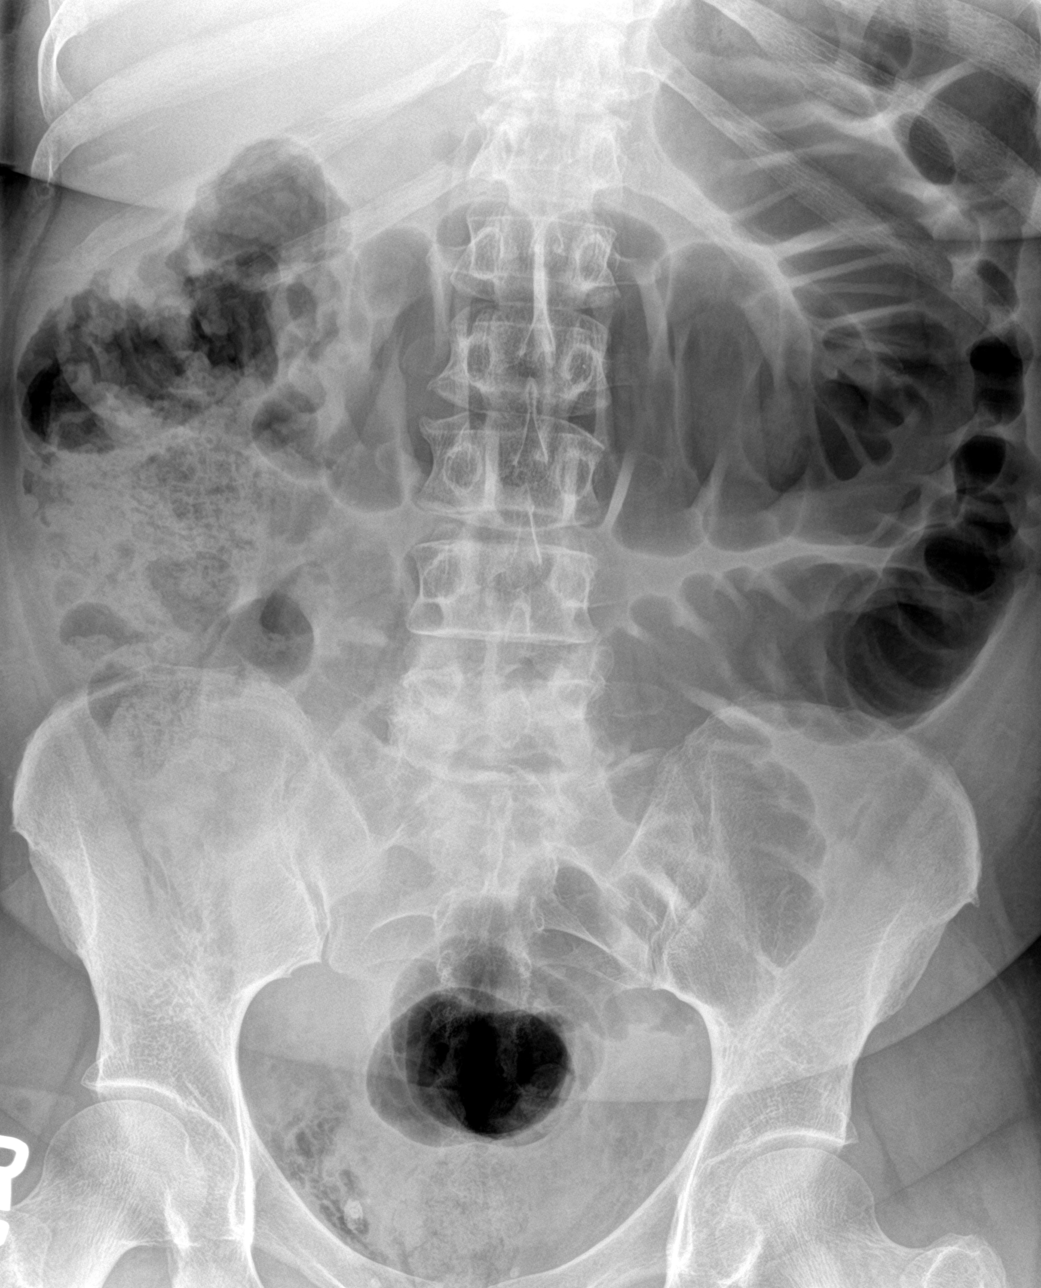

[abdomen supine (2 of 2)]
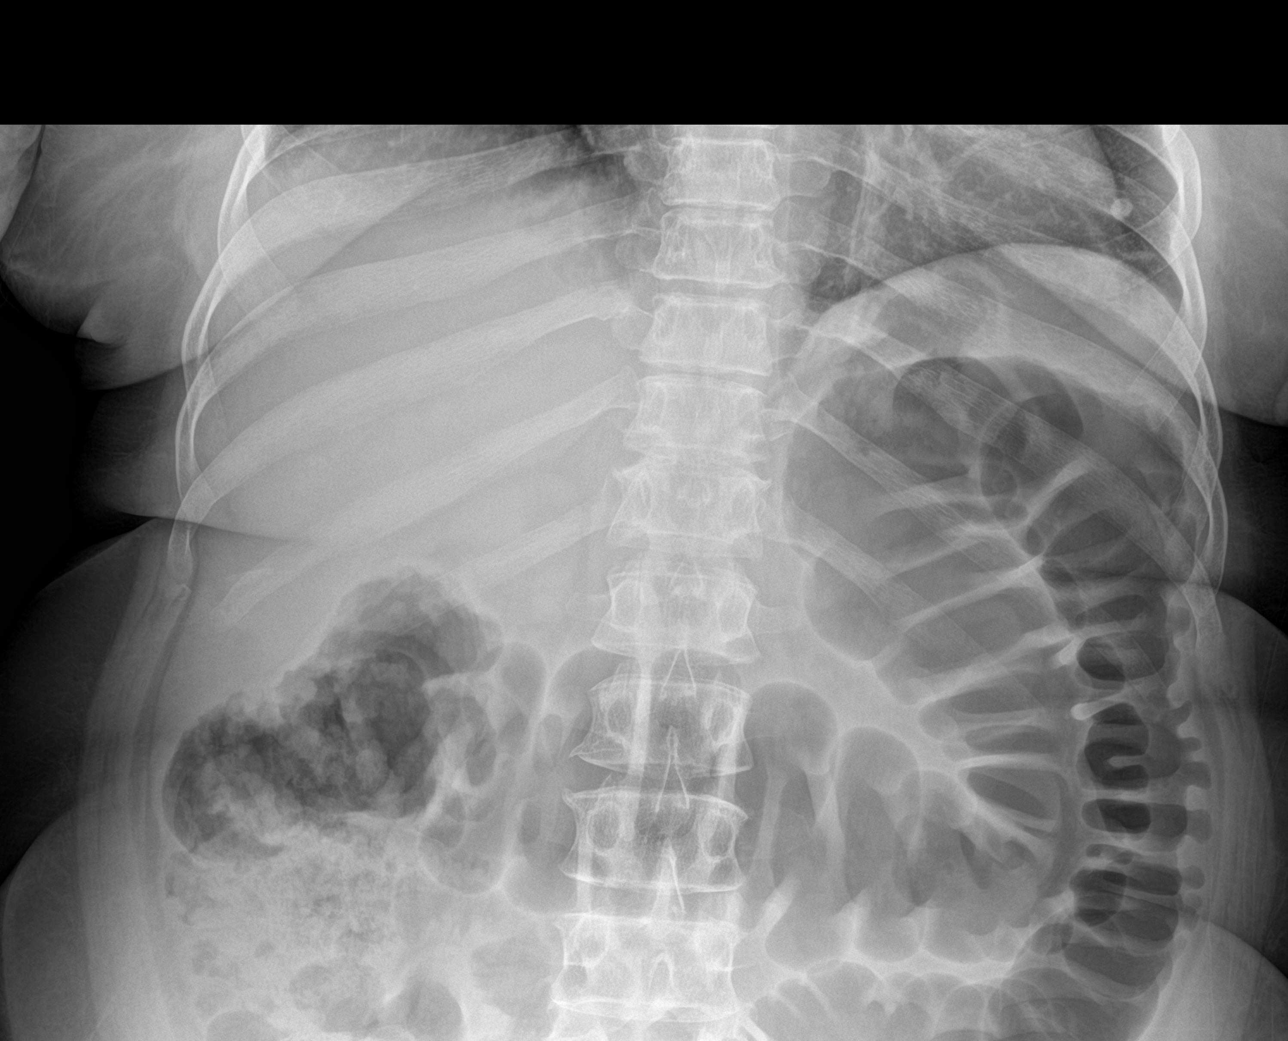

[4 of 4 positions shown; findings below may reference images not displayed]

FINDINGS: PA chest: There is atelectatic change in both mid lung regions.
There is a calcified granuloma in the left base. Lungs elsewhere
clear. Heart size and pulmonary vascularity are normal. No
adenopathy. There is atherosclerotic calcification in the aorta.

Supine and upright abdomen: Moderate stool is present in the colon.
There is mild dilatation of much of the colon with occasional
colonic air-fluid levels. No small bowel dilatation or air-fluid
level. No free air. There are phleboliths in the pelvis.
IMPRESSION: Suspect a degree of colonic ileus. No bowel obstruction evident.
Note that there is stool and air in the rectum. No free air
appreciable. There is atelectasis in the mid lung regions, slightly
more on the right than on the left. Small calcified granuloma left
base. Lungs elsewhere clear.

## 2017-03-09 IMAGING — DX DG ABDOMEN 2V
2 series · 2 of 2 positions shown · non-contrast
Comparison: 03/21/2016 and earlier.

CLINICAL DATA: 45-year-old female with cervical cancer status post
hysterectomy and salpingo-oophorectomy on 03/19/2016. Initial
encounter.

EXAM:
ABDOMEN - 2 VIEW

[abdomen erect]
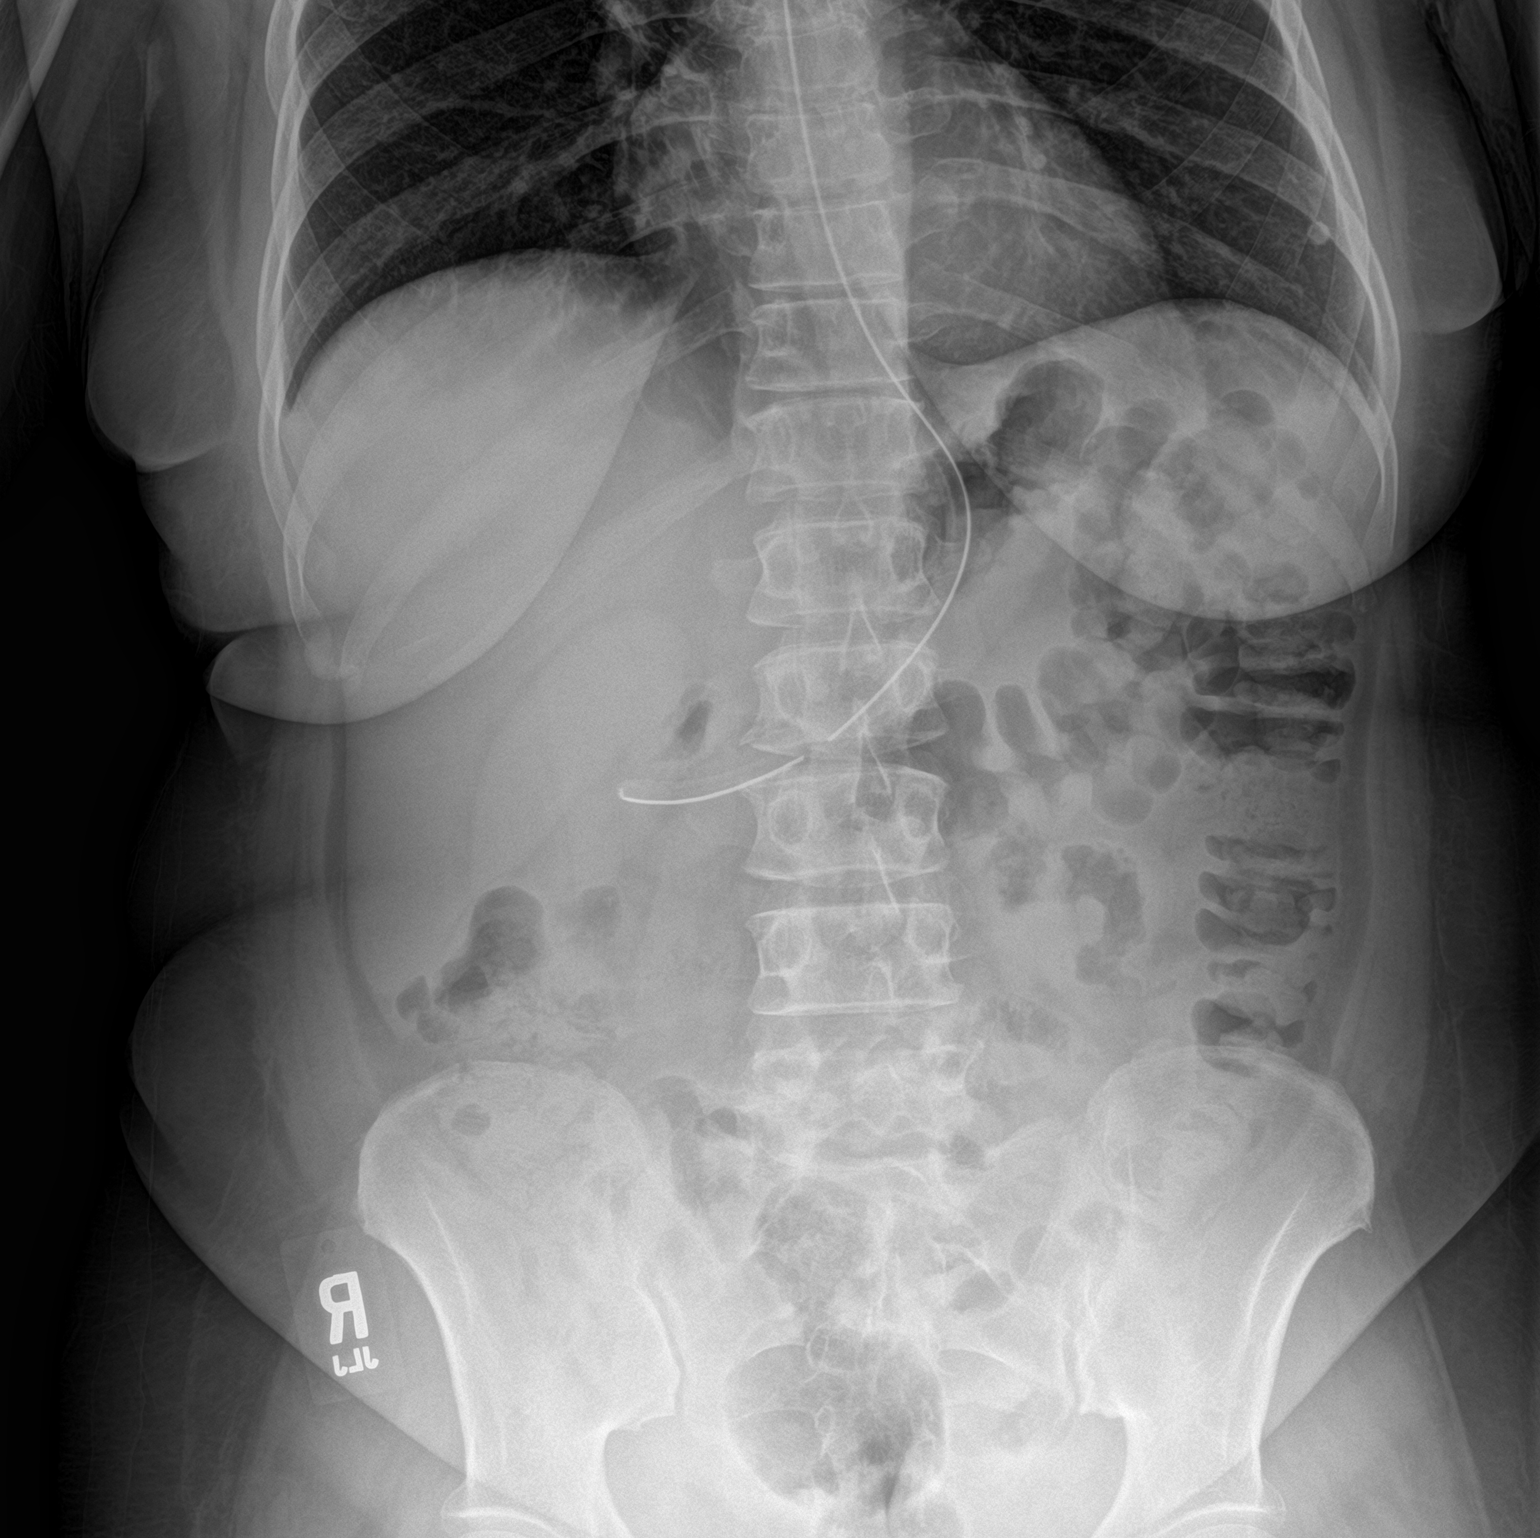

[abdomen supine]
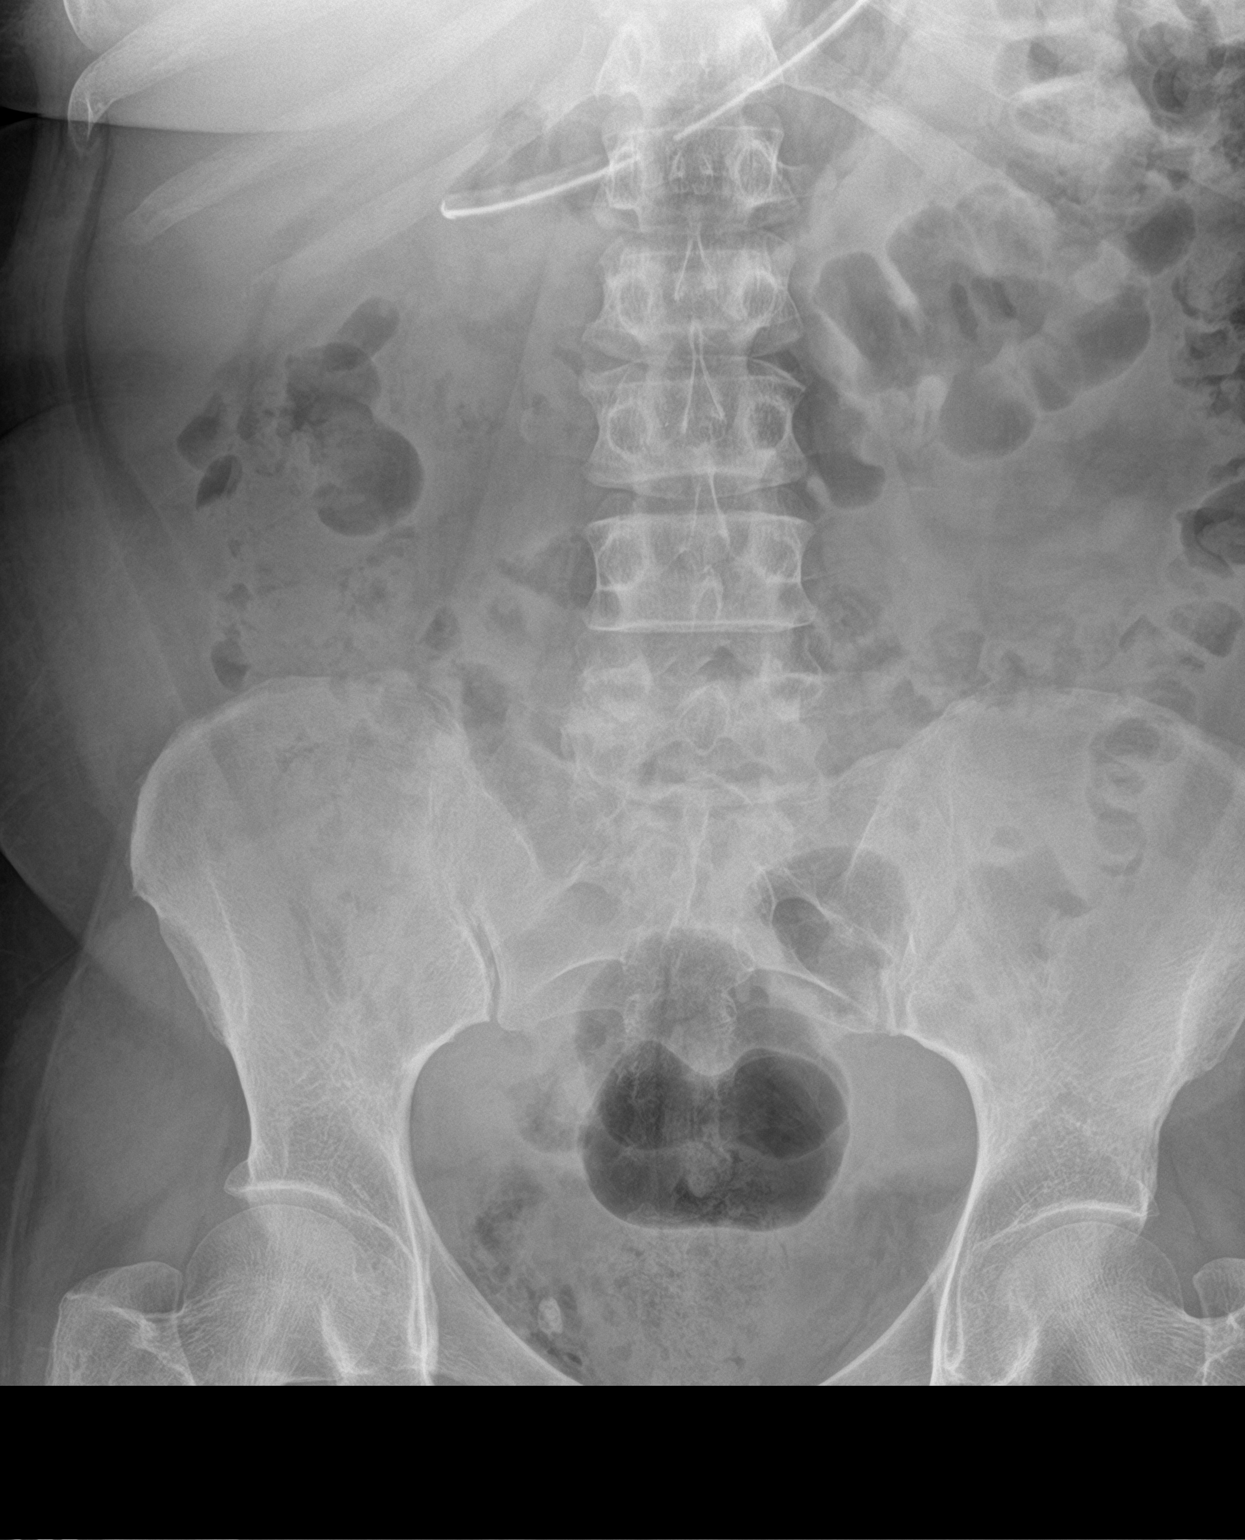

[2 of 2 positions shown; findings below may reference images not displayed]

FINDINGS: Upright and supine views. Negative lung bases (left lower lobe
calcified granuloma). No pneumoperitoneum. Enteric tube in place,
side hole at the level of the distal stomach. Non obstructed bowel
gas pattern. No dilated small bowel. Mild gaseous distension of the
sigmoid colon. Retained stool in the rectum. Small area of right
pelvic sidewall gas probably is postoperative in nature (image 2).
Nearby chronic pelvic phlebolith. Otherwise normal abdominal and
pelvic visceral contours. No acute osseous abnormality identified.
IMPRESSION: 1. Enteric tube in place, side hole the level of the distal stomach.
2.  Normal bowel gas pattern, no free air.
3. Likely postoperative extraperitoneal/pelvic sidewall gas on the
right.

## 2017-04-18 ENCOUNTER — Encounter: Payer: Self-pay | Admitting: Gynecologic Oncology

## 2017-04-18 ENCOUNTER — Ambulatory Visit: Payer: Self-pay | Attending: Gynecologic Oncology | Admitting: Gynecologic Oncology

## 2017-04-18 VITALS — BP 146/69 | HR 67 | Temp 98.2°F | Resp 14 | Ht 64.0 in | Wt 191.8 lb

## 2017-04-18 DIAGNOSIS — Z923 Personal history of irradiation: Secondary | ICD-10-CM

## 2017-04-18 DIAGNOSIS — Z8541 Personal history of malignant neoplasm of cervix uteri: Secondary | ICD-10-CM

## 2017-04-18 DIAGNOSIS — C531 Malignant neoplasm of exocervix: Secondary | ICD-10-CM

## 2017-04-18 NOTE — Patient Instructions (Signed)
Please notify Dr Denman George at phone number [phone] if you notice vaginal bleeding, new pelvic or abdominal pains, bloating, feeling full easy, or a change in bladder or bowel function.   Please return to see Dr Sondra Come as scheduled on March, 28th, 2019.  Please return to see Dr Denman George in June, 2019.

## 2017-04-18 NOTE — Progress Notes (Signed)
Consult Note: Gyn-Onc  Consult was requested by Dr. Roselie Awkward for the evaluation of Caitlin Flowers 46 y.o. female  CC:  Chief Complaint  Patient presents with  . Cervical Cancer    Follow up    Assessment/Plan:  Caitlin Flowers  is a 46 y.o.  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 Intermediate risk factors. S/p adjuvant whole pelvic RT completed March, 2018.  No evidence of disease.  I will continue to see her at 3 monthly intervals until March, 2020 after which I will see her back 6 monthly. We will perform annual paps with HPV surveillance for assessment of new HPV related disease - next pap in June, 2019.  HPI: Caitlin Flowers is a 46 year old [place] woman (speaks Amharic) who is seen in consultation at the request of Dr Roselie Awkward for high grade SCC of the cervix.  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 She was recommended to return for colposcopy but failed to do so.  In August, 2017 she began noticing edema of her left lower extremity. She was evaluated in the ED with a doppler of the lower extremity which was negative for DVT and a CT abdo/pelvis which showed no hydronephrosis or hydroureter, small uterine fibroids, but no lesions to explain her LE edema.  She followed up with Dr Roselie Awkward on 01/10/16 and proceed with colposcopy.   At the time of colposcopy a visible lesion was seen on the anterior lip of the cervix. It was biopsied and returned as high grade squamous cell carcinoma. Endocervical curette revealed CIN III.  The patient is otherwise healthy. SHe has had no prior abdominal surgeries, though has had a hysteroscopic resection of a myoma in the past. She denies HIV exposure and is a non-smoker.  She denies intermenstrual bleeding and abnormal discharge.  On 02/07/16 PET/CT was performed and showed no evidence of extracervical spread (including no  lymphadenopathy) or explanation for pre-existing LE lymphedema.  This was originally scheduled for October, 2017, however, the week before surgery, the patient cancelled her surgery based on the information she was provided by her family members.  I informed the patient that delays in treatment of her cancer could upstage her disease or make cure less likely.   On 03/19/16 she underwent robotic assisted radical hysterectomy, bilateral salpingectomy and lymphadenectomy and ovarian pexy. Final pathology showed a 3.5cm moderately differentiated squamous lesion with 12 of 60mm stromal invasion and LVSI present. Lymph nodes were negative.  She was admitted postop for colonic ileus which resolved with bowel rest.   She was received adjuvant external beam radiation and vaginal brachytherapy (45 Gy to the pelvis in 25 fractions and 18 Gy to the cuff in 3 fractions) due to her intermediate risk factors on pathology. Duration of therapy was 05/07/16 until 07/04/16.  Interval Hx:  Since therapy she has done well with no complaints. Her left LE lymphedema is stable.   Current Meds:  Outpatient Encounter Medications as of 04/18/2017  Medication Sig  . hydrocortisone (ANUSOL-HC) 25 MG suppository Place 1 suppository (25 mg total) rectally 2 (two) times daily. (Patient not taking: Reported on 06/20/2016)  . ibuprofen (ADVIL,MOTRIN) 800 MG tablet Take 1 tablet (800 mg total) by mouth every 8 (eight) hours as needed (mild pain). (Patient not taking: Reported on 06/11/2016)  . loperamide (IMODIUM) 2 MG capsule Take by mouth as needed for diarrhea or loose stools.  Marland Kitchen  ondansetron (ZOFRAN) 8 MG tablet Take 1 tablet (8 mg total) by mouth every 8 (eight) hours as needed for nausea or vomiting. (Patient not taking: Reported on 06/11/2016)  . oxyCODONE-acetaminophen (PERCOCET/ROXICET) 5-325 MG tablet Take 1-2 tablets by mouth every 4 (four) hours as needed (moderate to severe pain (when tolerating fluids)). (Patient not  taking: Reported on 04/25/2016)  . senna (SENOKOT) 8.6 MG TABS tablet Take 1 tablet (8.6 mg total) by mouth at bedtime. Use while taking percocet or if constipated (Patient not taking: Reported on 05/28/2016)  . zolpidem (AMBIEN) 5 MG tablet Take 1 tablet (5 mg total) by mouth at bedtime as needed for sleep. (Patient not taking: Reported on 05/28/2016)   No facility-administered encounter medications on file as of 04/18/2017.     Allergy:  Allergies  Allergen Reactions  . Influenza Vaccines Shortness Of Breath    Flu like symptoms     Social Hx:   Social History   Socioeconomic History  . Marital status: Legally Separated    Spouse name: Not on file  . Number of children: 2  . Years of education: Not on file  . Highest education level: Not on file  Social Needs  . Financial resource strain: Not on file  . Food insecurity - worry: Not on file  . Food insecurity - inability: Not on file  . Transportation needs - medical: Not on file  . Transportation needs - non-medical: Not on file  Occupational History  . Occupation: [employer]  Tobacco Use  . Smoking status: Never Smoker  . Smokeless tobacco: Never Used  Substance and Sexual Activity  . Alcohol use: No  . Drug use: No  . Sexual activity: Yes    Birth control/protection: None  Other Topics Concern  . Not on file  Social History Narrative  . Not on file    Past Surgical Hx:  Past Surgical History:  Procedure Laterality Date  . DILATION AND CURETTAGE OF UTERUS  02/11/12   Hysteroscopy with versapoint resection/myomectomy/  . INCISION AND DRAINAGE OF WOUND Right 01/13/2013   Procedure: IRRIGATION AND DEBRIDEMENT OF NAIL BED ABLATION AND FULL THICKNESS SKIN GRAFT ;  Surgeon: Schuyler Amor, MD;  Location: [facility];  Service: Orthopedics;  Laterality: Right;  . ROBOTIC ASSISTED LAP VAGINAL HYSTERECTOMY N/A 03/19/2016   Procedure: XI ROBOTIC ASSISTED LAPAROSCOPIC RADICAL HYSTERECTOMY;  Surgeon: Everitt Amber, MD;  Location: [facility];  Service: Gynecology;  Laterality: N/A;  . ROBOTIC ASSISTED SALPINGO OOPHERECTOMY Bilateral 03/19/2016   Procedure: XI ROBOTIC ASSISTED SALPINGO OOPHORECTOMY;  Surgeon: Everitt Amber, MD;  Location: [facility];  Service: Gynecology;  Laterality: Bilateral;  . SENTINEL NODE BIOPSY Bilateral 03/19/2016   Procedure: PELVIC LYMPH NODE BIOPSY;  Surgeon: Everitt Amber, MD;  Location[facility];  Service: Gynecology;  Laterality: Bilateral;  . WISDOM TOOTH EXTRACTION      Past Medical Hx:  Past Medical History:  Diagnosis Date  . Anemia   . Cervical cancer ([place])   . Fibroid (bleeding) (uterine) 2012  . History of radiation therapy 05/07/16-06/11/16 and 06/20/16-07/04/16   pelvis 45 Gy in 25 fracitons and vaginal cuff 18 Gy in 3 fractions  . SVD (spontaneous vaginal delivery)    x 2  . Wears glasses    to read    Past Gynecological History:  SVD x 2 Patient's last menstrual period was 02/06/2016.  Family Hx:  Family History  Problem Relation Age of Onset  . Hypertension Mother  Review of Systems:  Constitutional  Feels well,    ENT Normal appearing ears and nares bilaterally Skin/Breast  No rash, sores, jaundice, itching, dryness Cardiovascular  No chest pain, shortness of breath, or edema  Pulmonary  No cough or wheeze.  Gastro Intestinal  No nausea, vomitting, or diarrhoea. No bright red blood per rectum, no abdominal pain, change in bowel movement, or constipation.  Genito Urinary  No frequency, urgency, dysuria, no bleeding Musculo Skeletal  + LLE edema 3+ (stable, preceeded surgery) Neurologic  No weakness, numbness, change in gait,  Psychology  No depression, anxiety, insomnia.   Vitals:  Blood pressure (!) 146/69, pulse 67, temperature 98.2 F (36.8 C), temperature source Oral, resp. rate 14, height 5\' 4"  (1.626 m), weight 191 lb 12.8 oz (87 kg), last menstrual period 02/06/2016, SpO2 100 %.  Physical Exam: WD in NAD Neck  Supple NROM, without  any enlargements.  Lymph Node Survey No cervical supraclavicular or inguinal adenopathy however there is fullness in the left inguinal region. Cardiovascular  Pulse normal rate, regularity and rhythm. S1 and S2 normal.  Lungs  Clear to auscultation bilateraly, without wheezes/crackles/rhonchi. Good air movement.  Skin  No rash/lesions/breakdown  Psychiatry  Alert and oriented to person, place, and time  Abdomen  Normoactive bowel sounds, abdomen soft, non-tender and nonobese without evidence of hernia. Well healed abdominal incisions. Back No CVA tenderness Genito Urinary  Vulva/vagina: normal external genitalia. No lesions, blood, masses. Surgically absent cervix and uterus.  Rectal  Good tone, no masses no cul de sac nodularity.  Extremities  +1 left LE edema   [name], MD  04/18/2017, 1:45 PM

## 2017-07-17 ENCOUNTER — Ambulatory Visit
Admission: RE | Admit: 2017-07-17 | Payer: No Typology Code available for payment source | Source: Ambulatory Visit | Admitting: Radiation Oncology

## 2017-07-23 ENCOUNTER — Telehealth: Payer: Self-pay | Admitting: *Deleted

## 2017-07-23 NOTE — Telephone Encounter (Signed)
Called patient's daughter- Lanyaddis to reschedule fu for this patient due to [facility], rescheduled for 9:30 am, lvm for a return call

## 2017-07-24 ENCOUNTER — Encounter: Payer: Self-pay | Admitting: Radiation Oncology

## 2017-07-24 ENCOUNTER — Ambulatory Visit
Admission: RE | Admit: 2017-07-24 | Discharge: 2017-07-24 | Disposition: A | Discharge status: Home / Self Care | Payer: No Typology Code available for payment source | Source: Ambulatory Visit | Attending: Radiation Oncology | Admitting: Radiation Oncology

## 2017-07-24 ENCOUNTER — Telehealth: Payer: Self-pay | Admitting: *Deleted

## 2017-07-24 ENCOUNTER — Other Ambulatory Visit: Payer: Self-pay

## 2017-07-24 VITALS — BP 148/93 | HR 53 | Temp 97.7°F | Ht 64.0 in | Wt 189.6 lb

## 2017-07-24 DIAGNOSIS — C539 Malignant neoplasm of cervix uteri, unspecified: Secondary | ICD-10-CM

## 2017-07-24 DIAGNOSIS — C531 Malignant neoplasm of exocervix: Secondary | ICD-10-CM | POA: Insufficient documentation

## 2017-07-24 DIAGNOSIS — Z08 Encounter for follow-up examination after completed treatment for malignant neoplasm: Secondary | ICD-10-CM | POA: Insufficient documentation

## 2017-07-24 DIAGNOSIS — Z923 Personal history of irradiation: Secondary | ICD-10-CM | POA: Insufficient documentation

## 2017-07-24 DIAGNOSIS — M545 Low back pain: Secondary | ICD-10-CM | POA: Insufficient documentation

## 2017-07-24 DIAGNOSIS — Z8541 Personal history of malignant neoplasm of cervix uteri: Secondary | ICD-10-CM | POA: Insufficient documentation

## 2017-07-24 LAB — CMP (CANCER CENTER ONLY)
ALBUMIN: 4 g/dL (ref 3.5–5.0)
ALT: 15 U/L (ref 0–55)
AST: 16 U/L (ref 5–34)
Alkaline Phosphatase: 90 U/L (ref 40–150)
Anion gap: 7 (ref 3–11)
BILIRUBIN TOTAL: 0.5 mg/dL (ref 0.2–1.2)
BUN: 7 mg/dL (ref 7–26)
CO2: 27 mmol/L (ref 22–29)
Calcium: 9.3 mg/dL (ref 8.4–10.4)
Chloride: 108 mmol/L (ref 98–109)
Creatinine: 0.73 mg/dL (ref 0.60–1.10)
GFR, Est AFR Am: >60 mL/min (ref >60)
GFR, Estimated: >60 mL/min (ref >60)
GLUCOSE: 100 mg/dL (ref 70–140)
Potassium: 4.2 mmol/L (ref 3.5–5.1)
SODIUM: 142 mmol/L (ref 136–145)
TOTAL PROTEIN: 7.1 g/dL (ref 6.4–8.3)

## 2017-07-24 LAB — CBC WITH DIFFERENTIAL (CANCER CENTER ONLY)
BASOS PCT: 1 %
Basophils Absolute: 0 10*3/uL (ref 0.0–0.1)
EOS ABS: 0.2 10*3/uL (ref 0.0–0.5)
Eosinophils Relative: 6 %
HEMATOCRIT: 37.3 % (ref 34.8–46.6)
Hemoglobin: 12.5 g/dL (ref 11.6–15.9)
Lymphocytes Relative: 27 %
Lymphs Abs: 1.1 10*3/uL (ref 0.9–3.3)
MCH: 30.3 pg (ref 25.1–34.0)
MCHC: 33.5 g/dL (ref 31.5–36.0)
MCV: 90.6 fL (ref 79.5–101.0)
MONO ABS: 0.4 10*3/uL (ref 0.1–0.9)
MONOS PCT: 9 %
Neutro Abs: 2.4 10*3/uL (ref 1.5–6.5)
Neutrophils Relative %: 57 %
Platelet Count: 136 10*3/uL — ABNORMAL LOW (ref 145–400)
RBC: 4.12 MIL/uL (ref 3.70–5.45)
RDW: 12.1 % (ref 11.2–14.5)
WBC Count: 4.2 10*3/uL (ref 3.9–10.3)

## 2017-07-24 NOTE — Progress Notes (Signed)
  Radiation Oncology         [phone] ________________________________  Name: Caitlin Flowers MRN: [id number]  Date: 07/24/2017  DOB: Aug 17, 1970  Follow-Up Visit Note  CC: Patient, No Pcp Per  Everitt Amber, MD    ICD-10-CM   1. Primary cervical cancer ([place]) C53.9 CBC with Differential ([place])    CMP ([place] only)    CT Abdomen Pelvis W Contrast    Diagnosis:   47 y.o. woman with Stage IB1 squamous cell carcinoma of the cervix (moderately differentiated)  Interval Since Last Radiation:  13 months,  post op, close surgical margin, deeply invasive tumor  05/07/16-06/11/16: 45 Gy to the pelvis in 25 fractions 06/20/16-07/04/16: 18 Gy to the vaginal cuff in 3 fractions      Narrative:   Patient is here for follow up with an interpreter.  She reports having pain in her lower back at an 8/10 today.  She said  is worse with bending and sitting for long periods.  She also has had pain in her left lower abdomen/pelvis on and off for the past week.  She denies having any urinary/bowel issues or vaginal bleeding or discharge.  She is not using a vaginal dilator.  She reports having a good energy level and has gone back to work.  She said she has lost about 18 lbs by exercising and following a vegetarian diet.  She denies any nausea or problems with diarrhea                        ALLERGIES:  is allergic to influenza vaccines.  Meds: No current outpatient medications on file.   No current facility-administered medications for this encounter.     Physical Findings: The patient is in no acute distress. Patient is alert and oriented.  height is 5\' 4"  (1.626 m) and weight is 189 lb 9.6 oz (86 kg). Her oral temperature is 97.7 F (36.5 C). Her blood pressure is 148/93 (abnormal) and her pulse is 53 (abnormal). Her oxygen saturation is 100%. . Lungs are clear to auscultation bilaterally. Heart has regular rate and rhythm. No palpable cervical, supraclavicular, or axillary  adenopathy. Abdomen soft, non-tender, normal bowel sounds.On pelvic examination the external genitalia were unremarkable. A speculum exam was performed. There are no mucosal lesions noted in the vaginal vault.  . On bimanual  examination there were no pelvic masses appreciated.  Patient would not tolerate a rectal exam. Lab Findings: Lab Results  Component Value Date   WBC 4.2 07/24/2017   HGB 12.7 06/04/2016   HCT 37.3 07/24/2017   MCV 90.6 07/24/2017   PLT 136 (L) 07/24/2017    Radiographic Findings: No results found.  Impression: No evidence of recurrence on clinical exam today.  However the patient has developed new onset pain in the left lower quadrant and low back region concerning for possible recurrence.  Plan: Patient will be set up for routine blood work today and then be scheduled for a CT scan of the abdomen and pelvis.  Assuming no evidence of recurrence the patient will see Dr. Denman George in June with Pap smear planned at that time.  She will return for follow-up in radiation oncology in September of this year.  ____________________________________ Gery Pray, MD

## 2017-07-24 NOTE — Telephone Encounter (Signed)
CALLED PATIENT TO INFORM OF CT FOR 07-31-17- @ 10:30 AM @ [facility] - PT. TO BE NPO- 4 HRS. PRIOR TO TEST, PATIENT TO PICK -UP CONTRAST IN RADIOLOGY ON 07-30-17, LVM FOR A RETURN CALL- LVM FOR AMANEAL @ [phone]- ARRIVAL TIME FOR TEST 10:l5 AM

## 2017-07-24 NOTE — Progress Notes (Signed)
Patient is here for follow up with an interpreter.  She reports having pain in her lower back at an 8/10 today.  She said she has had this pain since surgery and is worse with bending and sitting for long periods.  She also has had pain in her left lower abdomen/pelvis on and off for the past week.  She denies having any urinary/bowel issues or vaginal bleeding or discharge.  She is not using a vaginal dilator.  She reports having a good energy level and has gone back to work.  She said she has lost about 18 lbs by exercising and following a vegetarian diet.  BP (!) 148/93 (BP Location: Left Arm, Patient Position: Sitting)   Pulse (!) 53   Temp 97.7 F (36.5 C) (Oral)   Ht 5\' 4"  (1.626 m)   Wt 189 lb 9.6 oz (86 kg)   LMP 02/06/2016   SpO2 100%   BMI 32.54 kg/m    Wt Readings from Last 3 Encounters:  07/24/17 189 lb 9.6 oz (86 kg)  04/18/17 191 lb 12.8 oz (87 kg)  01/16/17 195 lb 9.6 oz (88.7 kg)

## 2017-07-31 ENCOUNTER — Ambulatory Visit (HOSPITAL_COMMUNITY)
Admission: RE | Admit: 2017-07-31 | Discharge: 2017-07-31 | Disposition: A | Discharge status: Home / Self Care | Payer: No Typology Code available for payment source | Source: Ambulatory Visit | Attending: Radiation Oncology | Admitting: Radiation Oncology

## 2017-07-31 DIAGNOSIS — Z90710 Acquired absence of both cervix and uterus: Secondary | ICD-10-CM | POA: Insufficient documentation

## 2017-07-31 DIAGNOSIS — C539 Malignant neoplasm of cervix uteri, unspecified: Secondary | ICD-10-CM

## 2017-07-31 MED ORDER — IOHEXOL 300 MG/ML  SOLN
100.0000 mL | Freq: Once | INTRAMUSCULAR | Status: AC | PRN
  Administered 2017-07-31: 100 mL via INTRAVENOUS

## 2017-08-13 ENCOUNTER — Other Ambulatory Visit: Payer: Self-pay | Admitting: Radiation Oncology

## 2017-08-13 DIAGNOSIS — C539 Malignant neoplasm of cervix uteri, unspecified: Secondary | ICD-10-CM

## 2017-08-14 ENCOUNTER — Telehealth: Payer: Self-pay

## 2017-08-14 NOTE — Telephone Encounter (Signed)
With the help of interpreter service left VM on pt's phone informing her that Dr. Sondra Come would like her to come back in for a PET scan. Told her to call back should she have any questions/concerns.

## 2017-08-19 ENCOUNTER — Telehealth: Payer: Self-pay | Admitting: *Deleted

## 2017-08-19 NOTE — Telephone Encounter (Signed)
Called interp. Almyra Free to inform of Pet Scan for 08-22-17 - arrival time - 6:30 am @ [facility], pt. To be NPO- after midnight, spoke with interp. and she will tell the above pt.

## 2017-08-22 ENCOUNTER — Encounter (HOSPITAL_COMMUNITY)
Admission: RE | Admit: 2017-08-22 | Discharge: 2017-08-22 | Disposition: A | Discharge status: Home / Self Care | Payer: No Typology Code available for payment source | Source: Ambulatory Visit | Attending: Radiation Oncology | Admitting: Radiation Oncology

## 2017-08-22 DIAGNOSIS — C539 Malignant neoplasm of cervix uteri, unspecified: Secondary | ICD-10-CM

## 2017-08-22 LAB — GLUCOSE, CAPILLARY: GLUCOSE-CAPILLARY: 101 mg/dL — AB (ref 65–99)

## 2017-08-22 MED ORDER — FLUDEOXYGLUCOSE F - 18 (FDG) INJECTION
9.3300 | Freq: Once | INTRAVENOUS | Status: AC | PRN
  Administered 2017-08-22: 9.33 via INTRAVENOUS

## 2017-10-17 ENCOUNTER — Inpatient Hospital Stay: Payer: Self-pay | Attending: Gynecologic Oncology | Admitting: Gynecologic Oncology

## 2017-10-17 ENCOUNTER — Other Ambulatory Visit (HOSPITAL_COMMUNITY)
Admission: RE | Admit: 2017-10-17 | Discharge: 2017-10-17 | Disposition: A | Discharge status: Home / Self Care | Payer: No Typology Code available for payment source | Source: Ambulatory Visit | Attending: Gynecologic Oncology | Admitting: Gynecologic Oncology

## 2017-10-17 ENCOUNTER — Encounter: Payer: Self-pay | Admitting: Gynecologic Oncology

## 2017-10-17 VITALS — BP 125/76 | HR 60 | Temp 97.8°F | Resp 18 | Ht 63.0 in | Wt 180.0 lb

## 2017-10-17 DIAGNOSIS — Z923 Personal history of irradiation: Secondary | ICD-10-CM

## 2017-10-17 DIAGNOSIS — Z90710 Acquired absence of both cervix and uterus: Secondary | ICD-10-CM

## 2017-10-17 DIAGNOSIS — Z90722 Acquired absence of ovaries, bilateral: Secondary | ICD-10-CM

## 2017-10-17 DIAGNOSIS — C531 Malignant neoplasm of exocervix: Secondary | ICD-10-CM

## 2017-10-17 DIAGNOSIS — R2231 Localized swelling, mass and lump, right upper limb: Secondary | ICD-10-CM

## 2017-10-17 NOTE — Patient Instructions (Signed)
Please notify Dr Denman George at phone number [phone] if you notice vaginal bleeding, new pelvic or abdominal pains, bloating, feeling full easy, or a change in bladder or bowel function.   Please return to see Dr Denman George in December, 2019 and return to see Dr Sondra Come as scheduled in September, 2019.  Dr Denman George will schedule you to see the general surgeon's at [facility] to evaluate the lump on your right shoulder. She thinks it might be a lipoma.  Dr Denman George performed a pap test today and her office will contact you with results. This may take approximately 2 weeks.

## 2017-10-17 NOTE — Progress Notes (Signed)
Follow-up Note: Gyn-Onc  Consult was requested by Dr. Roselie Awkward for Caitlin evaluation of Caitlin Flowers 47 y.o. female  CC:  Chief Complaint  Flowers presents with  . Malignant neoplasm of exocervix [facility])    Assessment/Plan:  Caitlin Flowers  is a 47 y.o.  year old [place] woman with stage IB1 squamous cell carcinoma of Caitlin cervix (moderately differentiated). Intermediate risk factors. S/p adjuvant whole pelvic RT completed March, 2018.  No evidence of disease.  I will continue to see her at 3 monthly intervals until March, 2020 after which I will see her back 6 monthly. We will perform annual paps with HPV surveillance for assessment of new HPV related disease - next pap in June, 2020.  Right shoulder mass - growing in size. Clinically most consistent with lipoma. Will have her see [facility] surgeons to discuss if likely lipoma, and consider excision at their discretion.   HPI: Caitlin Flowers is a 47 year old [place] woman (speaks Amharic) who is seen in consultation at Caitlin request of Dr Roselie Awkward for high grade SCC of Caitlin cervix.  Caitlin Flowers's history began in 2014 when a routine pap test (her first) returned as HGSIL. She was recommended to return for colposcopy but failed to do so.  In August, 2017 she began noticing edema of her left lower extremity. She was evaluated in [place] ED with a doppler of Caitlin lower extremity which was negative for DVT and a CT abdo/pelvis which showed no hydronephrosis or hydroureter, small uterine fibroids, but no lesions to explain her LE edema.  She followed up with Dr Roselie Awkward on 01/10/16 and proceed with colposcopy.   At Caitlin time of colposcopy a visible lesion was seen on Caitlin anterior lip of Caitlin cervix. It was biopsied and returned as high grade squamous cell carcinoma. Endocervical curette revealed CIN III.  Caitlin Flowers is otherwise healthy. SHe has had no prior abdominal surgeries, though has had a hysteroscopic resection of a myoma in Caitlin past. She denies HIV  exposure and is a non-smoker.  She denies intermenstrual bleeding and abnormal discharge.  On 02/07/16 PET/CT was performed and showed no evidence of extracervical spread (including no lymphadenopathy) or explanation for pre-existing LE lymphedema.  This was originally scheduled for October, 2017, however, Caitlin week before surgery, Caitlin Flowers cancelled her surgery based on Caitlin information she was provided by her family members.  I informed Caitlin Flowers that delays in treatment of her cancer could upstage her disease or make cure less likely.   On 03/19/16 she underwent robotic assisted radical hysterectomy, bilateral salpingectomy and lymphadenectomy and ovarian pexy. Final pathology showed a 3.5cm moderately differentiated squamous lesion with 12 of 60mm stromal invasion and LVSI present. Lymph nodes were negative.  She was admitted postop for colonic ileus which resolved with bowel rest.   She was received adjuvant external beam radiation and vaginal brachytherapy (45 Gy to Caitlin pelvis in 25 fractions and 18 Gy to Caitlin cuff in 3 fractions) due to her intermediate risk factors on pathology. Duration of therapy was 05/07/16 until 07/04/16.  Interval Hx:  Since therapy she has done well with no complaints. Her left LE lymphedema is stable. She reports a growing soft mass on her right shoulder.    Current Meds:  No outpatient encounter medications on file as of 10/17/2017.   No facility-administered encounter medications on file as of 10/17/2017.     Allergy:  Allergies  Allergen Reactions  . Influenza Vaccines Shortness Of Breath  Flu like symptoms     Social Hx:   Social History   Socioeconomic History  . Marital status: Legally Separated    Spouse name: Not on file  . Number of children: 2  . Years of education: Not on file  . Highest education level: Not on file  Occupational History  . Occupation: Emergency planning/management officer  Social Needs  . Financial resource strain: Not on file  . Food  insecurity:    Worry: Not on file    Inability: Not on file  . Transportation needs:    Medical: Not on file    Non-medical: Not on file  Tobacco Use  . Smoking status: Never Smoker  . Smokeless tobacco: Never Used  Substance and Sexual Activity  . Alcohol use: No  . Drug use: No  . Sexual activity: Yes    Birth control/protection: None  Lifestyle  . Physical activity:    Days per week: Not on file    Minutes per session: Not on file  . Stress: Not on file  Relationships  . Social connections:    Talks on phone: Not on file    Gets together: Not on file    Attends religious service: Not on file    Active member of club or organization: Not on file    Attends meetings of clubs or organizations: Not on file    Relationship status: Not on file  . Intimate partner violence:    Fear of current or ex partner: Not on file    Emotionally abused: Not on file    Physically abused: Not on file    Forced sexual activity: Not on file  Other Topics Concern  . Not on file  Social History Narrative  . Not on file    Past Surgical Hx:  Past Surgical History:  Procedure Laterality Date  . DILATION AND CURETTAGE OF UTERUS  02/11/12   Hysteroscopy with versapoint resection/myomectomy/  . INCISION AND DRAINAGE OF WOUND Right 01/13/2013   Procedure: IRRIGATION AND DEBRIDEMENT OF NAIL BED ABLATION AND FULL THICKNESS SKIN GRAFT ;  Surgeon: Schuyler Amor, MD;  Location: [place] [place];  Service: Orthopedics;  Laterality: Right;  . ROBOTIC ASSISTED LAP VAGINAL HYSTERECTOMY N/A 03/19/2016   Procedure: XI ROBOTIC ASSISTED LAPAROSCOPIC RADICAL HYSTERECTOMY;  Surgeon: Everitt Amber, MD;  [facility];  Service: Gynecology;  Laterality: N/A;  . ROBOTIC ASSISTED SALPINGO OOPHERECTOMY Bilateral 03/19/2016   Procedure: XI ROBOTIC ASSISTED SALPINGO OOPHORECTOMY;  Surgeon: Everitt Amber, MD;  Location: [facility];  Service: Gynecology;  Laterality: Bilateral;  . SENTINEL NODE BIOPSY Bilateral  03/19/2016   Procedure: PELVIC LYMPH NODE BIOPSY;  Surgeon: Everitt Amber, MD;  [facility];  Service: Gynecology;  Laterality: Bilateral;  . WISDOM TOOTH EXTRACTION      Past Medical Hx:  Past Medical History:  Diagnosis Date  . Anemia   . Cervical cancer ([place])   . Fibroid (bleeding) (uterine) 2012  . History of radiation therapy 05/07/16-06/11/16 and 06/20/16-07/04/16   pelvis 45 Gy in 25 fracitons and vaginal cuff 18 Gy in 3 fractions  . SVD (spontaneous vaginal delivery)    x 2  . Wears glasses    to read    Past Gynecological History:  SVD x 2 Flowers's last menstrual period was 02/06/2016.  Family Hx:  Family History  Problem Relation Age of Onset  . Hypertension Mother     Review of Systems:  Constitutional  Feels well,    ENT Normal  appearing ears and nares bilaterally Skin/Breast  No rash, sores, jaundice, itching, dryness Cardiovascular  No chest pain, shortness of breath, or edema  Pulmonary  No cough or wheeze.  Gastro Intestinal  No nausea, vomitting, or diarrhoea. No bright red blood per rectum, no abdominal pain, change in bowel movement, or constipation.  Genito Urinary  No frequency, urgency, dysuria, no bleeding Musculo Skeletal  + LLE edema 3+ (stable, preceeded surgery), + right shoulder tip mass Neurologic  No weakness, numbness, change in gait,  Psychology  No depression, anxiety, insomnia.   Vitals:  Blood pressure 125/76, pulse 60, temperature 97.8 F (36.6 C), temperature source Oral, resp. rate 18, height 5\' 3"  (1.6 m), weight 180 lb (81.6 kg), last menstrual period 02/06/2016, SpO2 100 %.  Physical Exam: WD in NAD Neck  Supple NROM, without any enlargements.  Lymph Node Survey No cervical supraclavicular or inguinal adenopathy however there is fullness in Caitlin left inguinal region. Cardiovascular  Pulse normal rate, regularity and rhythm. S1 and S2 normal.  Lungs  Clear to auscultation bilateraly, without wheezes/crackles/rhonchi.  Good air movement.  Skin  No rash/lesions/breakdown  Psychiatry  Alert and oriented to person, place, and time  Abdomen  Normoactive bowel sounds, abdomen soft, non-tender and nonobese without evidence of hernia. Well healed abdominal incisions. Back No CVA tenderness Genito Urinary  Vulva/vagina: normal external genitalia. No lesions, blood, masses. Surgically absent cervix and uterus. Pap taken.  Rectal  Good tone, no masses no cul de sac nodularity.  Extremities  +1 left LE edema 5cm soft, fleshy mass below right acromium process laterally on right shoulder. Nontender. Does not feel obviously associated with glenohumeral joint. Not hot/erythematous or tender (not concerning for abscess). Appears most consistent with lipoma.    Thereasa Solo, MD  10/17/2017, 2:42 PM

## 2017-10-21 LAB — CYTOLOGY - PAP
DIAGNOSIS: NEGATIVE
HPV: NOT DETECTED

## 2017-10-22 ENCOUNTER — Telehealth: Payer: Self-pay

## 2017-10-22 NOTE — Telephone Encounter (Signed)
Outgoing call to patient regarding note from Joylene John NP (see below), no answer, left VM to return our call with contact info.

## 2017-10-22 NOTE — Telephone Encounter (Signed)
Pt called back and left VM returning our call.  Called pt, no answer, left on VM the following per Joylene John NP  "Pap with no evidence of pre-cancer or cancer.  HPV negative.  Please let her know."  Left our contact info also.

## 2017-10-22 NOTE — Telephone Encounter (Signed)
-----   Message from Dorothyann Gibbs, NP sent at 10/22/2017  7:19 AM EDT ----- Pap with no evidence of pre-cancer or cancer.  HPV negative.  Please let her know. ----- Message ----- From: Interface, Lab In [place] Sent: 10/21/2017   5:17 PM To: Dorothyann Gibbs, NP

## 2017-12-25 ENCOUNTER — Other Ambulatory Visit: Payer: Self-pay

## 2017-12-25 ENCOUNTER — Encounter: Payer: Self-pay | Admitting: Radiation Oncology

## 2017-12-25 ENCOUNTER — Ambulatory Visit
Admission: RE | Admit: 2017-12-25 | Discharge: 2017-12-25 | Disposition: A | Discharge status: Home / Self Care | Payer: Self-pay | Source: Ambulatory Visit | Attending: Radiation Oncology | Admitting: Radiation Oncology

## 2017-12-25 VITALS — BP 142/73 | HR 57 | Temp 97.7°F | Resp 20 | Ht 63.0 in | Wt 181.6 lb

## 2017-12-25 DIAGNOSIS — C539 Malignant neoplasm of cervix uteri, unspecified: Secondary | ICD-10-CM

## 2017-12-25 DIAGNOSIS — Z8541 Personal history of malignant neoplasm of cervix uteri: Secondary | ICD-10-CM | POA: Insufficient documentation

## 2017-12-25 DIAGNOSIS — Z08 Encounter for follow-up examination after completed treatment for malignant neoplasm: Secondary | ICD-10-CM | POA: Insufficient documentation

## 2017-12-25 NOTE — Progress Notes (Signed)
Pt presents for f/u with Dr. Sondra Come. Pt is accompanied by hospital interpreter. Pt denies dysuria/hematuria. Pt denies vaginal bleeding/discharge. Pt denies rectal bleeding, diarrhea/constipation. Pt denies nausea/vomiting.   BP (!) 142/73 (BP Location: Right Arm, Patient Position: Sitting)   Pulse (!) 57   Temp 97.7 F (36.5 C) (Oral)   Resp 20   Ht 5\' 3"  (1.6 m)   Wt 181 lb 9.6 oz (82.4 kg)   LMP 02/06/2016   SpO2 100%   BMI 32.17 kg/m   Wt Readings from Last 3 Encounters:  12/25/17 181 lb 9.6 oz (82.4 kg)  10/17/17 180 lb (81.6 kg)  07/24/17 189 lb 9.6 oz (86 kg)   Loma Sousa, RN BSN

## 2017-12-25 NOTE — Progress Notes (Signed)
  Radiation Oncology         (336) [phone] ________________________________  Name: Caitlin Flowers MRN: [id number]  Date: 12/25/2017  DOB: 28-Jun-1970  Follow-Up Visit Note  CC: Patient, No Pcp Per  Everitt Amber, MD    ICD-10-CM   1. Primary cervical cancer (HCC) C53.9     Diagnosis:   47 y.o. 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  She reports feeling well overall. She denies dysuria or hematuria, vaginal bleeding or discharge, rectal bleeding, diarrhea or constipation, and nausea or vomiting.                      ALLERGIES:  is allergic to influenza vaccines.  Meds: No current outpatient medications on file.   No current facility-administered medications for this encounter.    Review of Systems: A 10+ POINT REVIEW OF SYSTEMS WAS OBTAINED including neurology, dermatology, psychiatry, cardiac, respiratory, lymph, extremities, GI, GU, musculoskeletal, constitutional, reproductive, HEENT. All pertinent positives are noted in the HPI. All others are negative.  Physical Findings: The patient is in no acute distress. Patient is alert and oriented.  height is 5\' 3"  (1.6 m) and weight is 181 lb 9.6 oz (82.4 kg). Her oral temperature is 97.7 F (36.5 C). Her blood pressure is 142/73 (abnormal) and her pulse is 57 (abnormal). Her respiration is 20 and oxygen saturation is 100%. . Lungs are clear to auscultation bilaterally. Heart has regular rate and rhythm. No palpable cervical, supraclavicular, or axillary adenopathy. Abdomen soft, non-tender, normal bowel sounds. Patient has soft tissue swelling in the right shoulder region, which is consistent with  lipoma. On pelvic examination the external genitalia were unremarkable. A speculum exam was performed. There are no mucosal lesions noted in the vaginal vault. On bimanual  examination there were no pelvic masses appreciated.  Patient would not permit a rectal exam.  Lab Findings: Lab Results  Component Value Date   WBC 4.2 07/24/2017   HGB 12.5 07/24/2017   HCT 37.3 07/24/2017   MCV 90.6 07/24/2017   PLT 136 (L) 07/24/2017    Radiographic Findings: No results found.  Impression: Stage IB1 squamous cell carcinoma of the cervix   No evidence of recurrence on clinical exam today.   Plan: She will follow up with Dr. Denman George in December 2019 and follow up with radiation oncology in March 2020. Dr. Denman George plans on performing a Pap smear in June 2020.  -----------------------------------  Blair Promise, PhD, MD  This document serves as a record of services personally performed by Gery Pray, MD. It was created on his behalf by Wilburn Mylar, a trained medical scribe. The creation of this record is based on the scribe's personal observations and the provider's statements to them. This document has been checked and approved by the attending provider.

## 2018-04-17 ENCOUNTER — Encounter: Payer: Self-pay | Admitting: Gynecologic Oncology

## 2018-04-17 ENCOUNTER — Inpatient Hospital Stay: Payer: Self-pay | Attending: Gynecologic Oncology | Admitting: Gynecologic Oncology

## 2018-04-17 VITALS — BP 145/76 | HR 70 | Temp 98.6°F | Resp 16 | Ht 63.0 in | Wt 181.0 lb

## 2018-04-17 DIAGNOSIS — Z90722 Acquired absence of ovaries, bilateral: Secondary | ICD-10-CM | POA: Insufficient documentation

## 2018-04-17 DIAGNOSIS — Z923 Personal history of irradiation: Secondary | ICD-10-CM | POA: Insufficient documentation

## 2018-04-17 DIAGNOSIS — C531 Malignant neoplasm of exocervix: Secondary | ICD-10-CM | POA: Insufficient documentation

## 2018-04-17 DIAGNOSIS — Z90710 Acquired absence of both cervix and uterus: Secondary | ICD-10-CM | POA: Insufficient documentation

## 2018-04-17 NOTE — Patient Instructions (Signed)
Please return to see Dr Denman George in June, 2020.  Dr Denman George will attempt to have you seen by [facility] for the left shoulder mass.

## 2018-04-17 NOTE — Progress Notes (Signed)
Follow-up Note: Gyn-Onc  Consult was initially requested by Dr. Roselie Awkward for the evaluation of Caitlin Flowers 47 y.o. female  CC:  Chief Complaint  Patient presents with  . Malignant neoplasm of exocervix [facility])    Assessment/Plan:  [name]  is a 47 y.o.  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 Intermediate risk factors. S/p radical hysterectomy followed by adjuvant whole pelvic RT completed March, 2018.  No evidence of disease.  I will continue to see her at 3 monthly intervals until March, 2020 after which I will see her back 6 monthly. We will perform annual paps with HPV surveillance for assessment of new HPV related disease - next pap in June, 2020.  Right shoulder mass - growing in size. Clinically most consistent with lipoma. Says that she say a Dr Maudie Mercury who didn't know what to do with it. Will have her see [facility] surgeons to discuss if likely lipoma, and consider excision at their discretion.    HPI: Caitlin Flowers is a 47 year old [place] woman (speaks Amharic) who is seen in consultation at the request of Dr Roselie Awkward for high grade SCC of the cervix.  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 She was recommended to return for colposcopy but failed to do so.  In August, 2017 she began noticing edema of her left lower extremity. She was evaluated in the ED with a doppler of the lower extremity which was negative for DVT and a CT abdo/pelvis which showed no hydronephrosis or hydroureter, small uterine fibroids, but no lesions to explain her LE edema.  She followed up with Dr Roselie Awkward on 01/10/16 and proceed with colposcopy.   At the time of colposcopy a visible lesion was seen on the anterior lip of the cervix. It was biopsied and returned as high grade squamous cell carcinoma. Endocervical curette revealed CIN III.  The patient is otherwise healthy. SHe has had no  prior abdominal surgeries, though has had a hysteroscopic resection of a myoma in the past. She denies HIV exposure and is a non-smoker.  She denies intermenstrual bleeding and abnormal discharge.  On 02/07/16 PET/CT was performed and showed no evidence of extracervical spread (including no lymphadenopathy) or explanation for pre-existing LE lymphedema.  This was originally scheduled for October, 2017, however, the week before surgery, the patient cancelled her surgery based on the information she was provided by her family members.  I informed the patient that delays in treatment of her cancer could upstage her disease or make cure less likely.   On 03/19/16 she underwent robotic assisted radical hysterectomy, bilateral salpingectomy and lymphadenectomy and ovarian pexy. Final pathology showed a 3.5cm moderately differentiated squamous lesion with 12 of 29mm stromal invasion and LVSI present. Lymph nodes were negative.  She was admitted postop for colonic ileus which resolved with bowel rest.   She was received adjuvant external beam radiation and vaginal brachytherapy (45 Gy to the pelvis in 25 fractions and 18 Gy to the cuff in 3 fractions) due to her intermediate risk factors on pathology. Duration of therapy was 05/07/16 until 07/04/16.  Interval Hx:  Since therapy she has done well with no complaints. Her left LE lymphedema is stable. She reports a growing soft mass on her right shoulder. We attempted to have her seen by [facility] but it is unclear that she saw them. She has been applying garlic poultices to this area.  She had a singular episode of vaginal spotting with wiping today. No other bleeding episodes.   Current Meds:  Outpatient Encounter Medications as of 04/17/2018  Medication Sig  . DM-Doxylamine-Acetaminophen (NYQUIL COLD & FLU PO) Take by mouth.  Marland Kitchen ibuprofen (ADVIL,MOTRIN) 200 MG tablet Take 200 mg by mouth every 6 (six) hours as needed.  . Pseudoephedrine-APAP-DM (DAYQUIL PO)  Take by mouth.   No facility-administered encounter medications on file as of 04/17/2018.     Allergy:  Allergies  Allergen Reactions  . Influenza Vaccines Shortness Of Breath    Flu like symptoms     Social Hx:   Social History   Socioeconomic History  . Marital status: Legally Separated    Spouse name: Not on file  . Number of children: 2  . Years of education: Not on file  . Highest education level: Not on file  Occupational History  . Occupation: Emergency planning/management officer  Social Needs  . Financial resource strain: Not on file  . Food insecurity:    Worry: Not on file    Inability: Not on file  . Transportation needs:    Medical: Not on file    Non-medical: Not on file  Tobacco Use  . Smoking status: Never Smoker  . Smokeless tobacco: Never Used  Substance and Sexual Activity  . Alcohol use: No  . Drug use: No  . Sexual activity: Yes    Birth control/protection: None  Lifestyle  . Physical activity:    Days per week: Not on file    Minutes per session: Not on file  . Stress: Not on file  Relationships  . Social connections:    Talks on phone: Not on file    Gets together: Not on file    Attends religious service: Not on file    Active member of club or organization: Not on file    Attends meetings of clubs or organizations: Not on file    Relationship status: Not on file  . Intimate partner violence:    Fear of current or ex partner: Not on file    Emotionally abused: Not on file    Physically abused: Not on file    Forced sexual activity: Not on file  Other Topics Concern  . Not on file  Social History Narrative  . Not on file    Past Surgical Hx:  Past Surgical History:  Procedure Laterality Date  . DILATION AND CURETTAGE OF UTERUS  02/11/12   Hysteroscopy with versapoint resection/myomectomy/  . INCISION AND DRAINAGE OF WOUND Right 01/13/2013   Procedure: IRRIGATION AND DEBRIDEMENT OF NAIL BED ABLATION AND FULL THICKNESS SKIN GRAFT ;  Surgeon: Schuyler Amor, MD;  Location: [place];  Service: Orthopedics;  Laterality: Right;  . ROBOTIC ASSISTED LAP VAGINAL HYSTERECTOMY N/A 03/19/2016   Procedure: XI ROBOTIC ASSISTED LAPAROSCOPIC RADICAL HYSTERECTOMY;  Surgeon: Everitt Amber, MD;  Location: [facility];  Service: Gynecology;  Laterality: N/A;  . ROBOTIC ASSISTED SALPINGO OOPHERECTOMY Bilateral 03/19/2016   Procedure: XI ROBOTIC ASSISTED SALPINGO OOPHORECTOMY;  Surgeon: Everitt Amber, MD;  Location: [facility];  Service: Gynecology;  Laterality: Bilateral;  . SENTINEL NODE BIOPSY Bilateral 03/19/2016   Procedure: PELVIC LYMPH NODE BIOPSY;  Surgeon: Everitt Amber, MD;  Location: [facility];  Service: Gynecology;  Laterality: Bilateral;  . WISDOM TOOTH EXTRACTION      Past Medical Hx:  Past Medical History:  Diagnosis Date  . Anemia   . Cervical cancer ([place])   . Fibroid (  bleeding) (uterine) 2012  . History of radiation therapy 05/07/16-06/11/16 and 06/20/16-07/04/16   pelvis 45 Gy in 25 fracitons and vaginal cuff 18 Gy in 3 fractions  . SVD (spontaneous vaginal delivery)    x 2  . Wears glasses    to read    Past Gynecological History:  SVD x 2 Patient's last menstrual period was 02/06/2016.  Family Hx:  Family History  Problem Relation Age of Onset  . Hypertension Mother     Review of Systems:  Constitutional  Feels well,    ENT Normal appearing ears and nares bilaterally Skin/Breast  No rash, sores, jaundice, itching, dryness Cardiovascular  No chest pain, shortness of breath, or edema  Pulmonary  No cough or wheeze.  Gastro Intestinal  No nausea, vomitting, or diarrhoea. No bright red blood per rectum, no abdominal pain, change in bowel movement, or constipation.  Genito Urinary  No frequency, urgency, dysuria, no bleeding Musculo Skeletal  + LLE edema 3+ (stable, preceeded surgery), + right shoulder tip mass Neurologic  No weakness, numbness, change in gait,  Psychology  No depression, anxiety, insomnia.   Vitals:   Blood pressure (!) 145/76, pulse 70, temperature 98.6 F (37 C), temperature source Oral, resp. rate 16, height 5\' 3"  (1.6 m), weight 181 lb (82.1 kg), last menstrual period 02/06/2016, SpO2 100 %.  Physical Exam: WD in NAD Neck  Supple NROM, without any enlargements.  Lymph Node Survey No cervical supraclavicular or inguinal adenopathy however there is fullness in the left inguinal region. Cardiovascular  Pulse normal rate, regularity and rhythm. S1 and S2 normal.  Lungs  Clear to auscultation bilateraly, without wheezes/crackles/rhonchi. Good air movement.  Skin  No rash/lesions/breakdown  Psychiatry  Alert and oriented to person, place, and time  Abdomen  Normoactive bowel sounds, abdomen soft, non-tender and nonobese without evidence of hernia. Well healed abdominal incisions. Back No CVA tenderness Genito Urinary  Vulva/vagina: normal external genitalia. No lesions, blood, masses. Surgically absent cervix and uterus. Pap taken.  Rectal  Good tone, no masses no cul de sac nodularity.  Extremities  +1 left LE edema 5-6cm soft, fleshy mass below right acromium process laterally on right shoulder. Nontender. Does not feel obviously associated with glenohumeral joint. Not hot/erythematous or tender (not concerning for abscess). Appears most consistent with lipoma.    Thereasa Solo, MD  04/17/2018, 3:58 PM

## 2018-04-20 ENCOUNTER — Telehealth: Payer: Self-pay | Admitting: Oncology

## 2018-04-20 NOTE — Telephone Encounter (Signed)
Called [facility] and asked what cost would be for patient.  They said $166.00 for initial consult.  Faxed referral to [phone].

## 2018-04-27 NOTE — Telephone Encounter (Signed)
Called [facility] to check on referral.  They said they are working on getting her an appointment.

## 2018-06-29 ENCOUNTER — Encounter: Payer: Self-pay | Admitting: Radiation Oncology

## 2018-06-29 ENCOUNTER — Other Ambulatory Visit: Payer: Self-pay

## 2018-06-29 ENCOUNTER — Ambulatory Visit
Admission: RE | Admit: 2018-06-29 | Discharge: 2018-06-29 | Disposition: A | Discharge status: Home / Self Care | Payer: Self-pay | Source: Ambulatory Visit | Attending: Radiation Oncology | Admitting: Radiation Oncology

## 2018-06-29 VITALS — BP 148/93 | HR 60 | Temp 98.9°F | Resp 20 | Ht 63.0 in | Wt 181.6 lb

## 2018-06-29 DIAGNOSIS — C539 Malignant neoplasm of cervix uteri, unspecified: Secondary | ICD-10-CM

## 2018-06-29 DIAGNOSIS — Z8541 Personal history of malignant neoplasm of cervix uteri: Secondary | ICD-10-CM | POA: Insufficient documentation

## 2018-06-29 DIAGNOSIS — Z923 Personal history of irradiation: Secondary | ICD-10-CM | POA: Insufficient documentation

## 2018-06-29 NOTE — Progress Notes (Signed)
Pt here today for a follow-up appointment for cervical cancer. Pt denies having any pain or fatigue. Pt denies having hematuria or dysuria. Pt denies having any diarrhea. Pt denies having any nausea or vomiting.   BP (!) 148/93 (BP Location: Left Arm, Patient Position: Sitting)   Pulse 60   Temp 98.9 F (37.2 C) (Oral)   Resp 20   Ht 5\' 3"  (1.6 m)   Wt 181 lb 9.6 oz (82.4 kg)   LMP 02/06/2016   SpO2 100%   BMI 32.17 kg/m    Wt Readings from Last 3 Encounters:  06/29/18 181 lb 9.6 oz (82.4 kg)  04/17/18 181 lb (82.1 kg)  12/25/17 181 lb 9.6 oz (82.4 kg)

## 2018-06-29 NOTE — Progress Notes (Signed)
.     Radiation Oncology         [phone]) [phone] ________________________________  Name: Caitlin Flowers MRN: [id number]  Date: 06/29/2018  DOB: 1970/09/01  Follow-Up Visit Note  CC: Patient, No Pcp Per  Everitt Amber, MD    ICD-10-CM   1. Primary cervical cancer (HCC) C53.9     Diagnosis:   48 y.o. 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  She reports feeling well overall. She denies any new medical issues. She denies dysuria or hematuria, vaginal bleeding or discharge, rectal bleeding, diarrhea or constipation, bloating,  nausea or vomiting.                      ALLERGIES:  is allergic to influenza vaccines.  Meds: Current Outpatient Medications  Medication Sig Dispense Refill  . DM-Doxylamine-Acetaminophen (NYQUIL COLD & FLU PO) Take by mouth.    Marland Kitchen ibuprofen (ADVIL,MOTRIN) 200 MG tablet Take 200 mg by mouth every 6 (six) hours as needed.    . Pseudoephedrine-APAP-DM (DAYQUIL PO) Take by mouth.     No current facility-administered medications for this encounter.    Review of Systems: A 10+ POINT REVIEW OF SYSTEMS WAS OBTAINED including neurology, dermatology, psychiatry, cardiac, respiratory, lymph, extremities, GI, GU, musculoskeletal, constitutional, reproductive, HEENT. All pertinent positives are noted in the HPI. All others are negative.  Physical Findings: The patient is in no acute distress. Patient is alert and oriented.  height is 5\' 3"  (1.6 m) and weight is 181 lb 9.6 oz (82.4 kg). Her oral temperature is 98.9 F (37.2 C). Her blood pressure is 148/93 (abnormal) and her pulse is 60. Her respiration is 20 and oxygen saturation is 100%. . Lungs are clear to  auscultation bilaterally. Heart has regular rate and rhythm. No palpable cervical, supraclavicular, or axillary adenopathy. Abdomen soft, non-tender, normal bowel sounds. Patient has soft tissue swelling in the right shoulder region, which is consistent with lipoma. On pelvic examination the external genitalia were unremarkable. A speculum exam was performed. There are no mucosal lesions noted in the vaginal vault. On bimanual  examination there were no pelvic masses appreciated.  Patient would not permit a rectal exam.  Lab Findings: Lab Results  Component Value Date   WBC 4.2 07/24/2017   HGB 12.5 07/24/2017   HCT 37.3 07/24/2017   MCV 90.6 07/24/2017   PLT 136 (L) 07/24/2017    Radiographic Findings: No results found.  Impression: Stage IB1 squamous cell carcinoma of the cervix   No evidence of recurrence on clinical exam today.   Plan: She will follow up with Dr. Denman George in June 2020 and follow up with radiation oncology in December. Dr. Denman George plans on performing a Pap smear in June 2020.  -----------------------------------  Blair Promise, PhD, MD  This document serves as a record of services personally performed by Gery Pray, MD. It was created on his behalf by Mary-Margaret Loma Messing, a trained medical scribe. The creation of this record is based on the scribe's personal observations and the provider's statements to them. This document has been checked and approved by the attending provider.

## 2018-10-16 ENCOUNTER — Inpatient Hospital Stay: Payer: Self-pay | Attending: Gynecologic Oncology | Admitting: Gynecologic Oncology

## 2018-10-26 ENCOUNTER — Telehealth: Payer: Self-pay | Admitting: *Deleted

## 2018-10-26 NOTE — Telephone Encounter (Signed)
Called and left a message for the patient and her family with the appt date/time

## 2018-11-11 ENCOUNTER — Telehealth: Payer: Self-pay | Admitting: *Deleted

## 2018-11-11 NOTE — Telephone Encounter (Signed)
Patient called and moved her appt from the afternoon to the morning

## 2018-11-16 ENCOUNTER — Encounter: Payer: Self-pay | Admitting: Gynecologic Oncology

## 2018-11-16 ENCOUNTER — Other Ambulatory Visit (HOSPITAL_COMMUNITY)
Admission: RE | Admit: 2018-11-16 | Discharge: 2018-11-16 | Disposition: A | Discharge status: Home / Self Care | Payer: Self-pay | Source: Ambulatory Visit | Attending: Gynecologic Oncology | Admitting: Gynecologic Oncology

## 2018-11-16 ENCOUNTER — Other Ambulatory Visit: Payer: Self-pay

## 2018-11-16 ENCOUNTER — Inpatient Hospital Stay: Payer: Self-pay | Attending: Gynecologic Oncology | Admitting: Gynecologic Oncology

## 2018-11-16 VITALS — BP 147/85 | HR 81 | Temp 98.2°F | Resp 18 | Ht 63.0 in | Wt 181.2 lb

## 2018-11-16 DIAGNOSIS — Z08 Encounter for follow-up examination after completed treatment for malignant neoplasm: Secondary | ICD-10-CM | POA: Insufficient documentation

## 2018-11-16 DIAGNOSIS — Z923 Personal history of irradiation: Secondary | ICD-10-CM | POA: Insufficient documentation

## 2018-11-16 DIAGNOSIS — Z90722 Acquired absence of ovaries, bilateral: Secondary | ICD-10-CM | POA: Insufficient documentation

## 2018-11-16 DIAGNOSIS — C531 Malignant neoplasm of exocervix: Secondary | ICD-10-CM | POA: Insufficient documentation

## 2018-11-16 DIAGNOSIS — Z90710 Acquired absence of both cervix and uterus: Secondary | ICD-10-CM | POA: Insufficient documentation

## 2018-11-16 DIAGNOSIS — Z8541 Personal history of malignant neoplasm of cervix uteri: Secondary | ICD-10-CM | POA: Insufficient documentation

## 2018-11-16 NOTE — Patient Instructions (Signed)
Please notify Dr Denman George at phone number [phone] if you notice vaginal bleeding, new pelvic or abdominal pains, bloating, feeling full easy, or a change in bladder or bowel function.   Dr Denman George has written to clear you of heavy lifting at work.   Please contact Dr Serita Grit office (at [phone]) in November, 2020 to request an appointment with her for January, 2021.

## 2018-11-16 NOTE — Progress Notes (Signed)
Follow-up Note: Gyn-Onc  Consult was initially requested by Dr. Roselie Awkward for the evaluation of Caitlin Flowers 48 y.o. female  CC:  Chief Complaint  Patient presents with  . Cervical Cancer    Assessment/Plan:  [name]  is a 48 y.o.  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 Intermediate risk factors. S/p radical hysterectomy followed by adjuvant whole pelvic RT completed March, 2018.  No evidence of disease.  I will continue to see her at 6 monthly intervals until March, 2023. We will perform annual paps with HPV surveillance for assessment of new HPV related disease - next pap in June, 2021.   HPI: Caitlin Flowers is a 48 year old [place] woman (speaks Amharic) who is seen in consultation at the request of Dr Roselie Awkward for high grade SCC of the cervix.  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 She was recommended to return for colposcopy but failed to do so.  In August, 2017 she began noticing edema of her left lower extremity. She was evaluated in the ED with a doppler of the lower extremity which was negative for DVT and a CT abdo/pelvis which showed no hydronephrosis or hydroureter, small uterine fibroids, but no lesions to explain her LE edema.  She followed up with Dr Roselie Awkward on 01/10/16 and proceed with colposcopy.   At the time of colposcopy a visible lesion was seen on the anterior lip of the cervix. It was biopsied and returned as high grade squamous cell carcinoma. Endocervical curette revealed CIN III.  The patient is otherwise healthy. SHe has had no prior abdominal surgeries, though has had a hysteroscopic resection of a myoma in the past. She denies HIV exposure and is a non-smoker.  She denies intermenstrual bleeding and abnormal discharge.  On 02/07/16 PET/CT was performed and showed no evidence of extracervical spread (including no lymphadenopathy) or  explanation for pre-existing LE lymphedema.  This was originally scheduled for October, 2017, however, the week before surgery, the patient cancelled her surgery based on the information she was provided by her family members.  I informed the patient that delays in treatment of her cancer could upstage her disease or make cure less likely.   On 03/19/16 she underwent robotic assisted radical hysterectomy, bilateral salpingectomy and lymphadenectomy and ovarian pexy. Final pathology showed a 3.5cm moderately differentiated squamous lesion with 12 of 41mm stromal invasion and LVSI present. Lymph nodes were negative.  She was admitted postop for colonic ileus which resolved with bowel rest.   She was received adjuvant external beam radiation and vaginal brachytherapy (45 Gy to the pelvis in 25 fractions and 18 Gy to the cuff in 3 fractions) due to her intermediate risk factors on pathology. Duration of therapy was 05/07/16 until 07/04/16.  Interval Hx:  Since therapy she has done well with no complaints. Her left LE lymphedema is stable. She reports pain in the pelvis with heavy lifting at work and requests a letter to clear her from lifting heavy objects at work. She denies symptoms consistent with recurrence including bleeding.   Current Meds:  Outpatient Encounter Medications as of 11/16/2018  Medication Sig  . DM-Doxylamine-Acetaminophen (NYQUIL COLD & FLU PO) Take by mouth.  Marland Kitchen ibuprofen (ADVIL,MOTRIN) 200 MG tablet Take 200 mg by mouth every 6 (six) hours as needed.  . Pseudoephedrine-APAP-DM (DAYQUIL PO) Take by mouth.   No facility-administered encounter medications on file as of  11/16/2018.     Allergy:  Allergies  Allergen Reactions  . Influenza Vaccines Shortness Of Breath    Flu like symptoms     Social Hx:   Social History   Socioeconomic History  . Marital status: Legally Separated    Spouse name: Not on file  . Number of children: 2  . Years of education: Not on file  .  Highest education level: Not on file  Occupational History  . Occupation: Emergency planning/management officer  Social Needs  . Financial resource strain: Not on file  . Food insecurity    Worry: Not on file    Inability: Not on file  . Transportation needs    Medical: No    Non-medical: No  Tobacco Use  . Smoking status: Never Smoker  . Smokeless tobacco: Never Used  Substance and Sexual Activity  . Alcohol use: No  . Drug use: No  . Sexual activity: Yes    Birth control/protection: None  Lifestyle  . Physical activity    Days per week: Not on file    Minutes per session: Not on file  . Stress: Not on file  Relationships  . Social Herbalist on phone: Not on file    Gets together: Not on file    Attends religious service: Not on file    Active member of club or organization: Not on file    Attends meetings of clubs or organizations: Not on file    Relationship status: Not on file  . Intimate partner violence    Fear of current or ex partner: Not on file    Emotionally abused: Not on file    Physically abused: Not on file    Forced sexual activity: Not on file  Other Topics Concern  . Not on file  Social History Narrative  . Not on file    Past Surgical Hx:  Past Surgical History:  Procedure Laterality Date  . DILATION AND CURETTAGE OF UTERUS  02/11/12   Hysteroscopy with versapoint resection/myomectomy/  . INCISION AND DRAINAGE OF WOUND Right 01/13/2013   Procedure: IRRIGATION AND DEBRIDEMENT OF NAIL BED ABLATION AND FULL THICKNESS SKIN GRAFT ;  Surgeon: Schuyler Amor, MD;  Location: [facility];  Service: Orthopedics;  Laterality: Right;  . ROBOTIC ASSISTED LAP VAGINAL HYSTERECTOMY N/A 03/19/2016   Procedure: XI ROBOTIC ASSISTED LAPAROSCOPIC RADICAL HYSTERECTOMY;  Surgeon: Everitt Amber, MD;  Location: [facility];  Service: Gynecology;  Laterality: N/A;  . ROBOTIC ASSISTED SALPINGO OOPHERECTOMY Bilateral 03/19/2016   Procedure: XI ROBOTIC ASSISTED SALPINGO  OOPHORECTOMY;  Surgeon: Everitt Amber, MD;  Location: [facility];  Service: Gynecology;  Laterality: Bilateral;  . SENTINEL NODE BIOPSY Bilateral 03/19/2016   Procedure: PELVIC LYMPH NODE BIOPSY;  Surgeon: Everitt Amber, MD;  Location: [facility];  Service: Gynecology;  Laterality: Bilateral;  . WISDOM TOOTH EXTRACTION      Past Medical Hx:  Past Medical History:  Diagnosis Date  . Anemia   . Cervical cancer ([place])   . Fibroid (bleeding) (uterine) 2012  . History of radiation therapy 05/07/16-06/11/16 and 06/20/16-07/04/16   pelvis 45 Gy in 25 fracitons and vaginal cuff 18 Gy in 3 fractions  . SVD (spontaneous vaginal delivery)    x 2  . Wears glasses    to read    Past Gynecological History:  SVD x 2 Patient's last menstrual period was 02/06/2016.  Family Hx:  Family History  Problem Relation Age of Onset  . Hypertension Mother  Review of Systems:  Constitutional  Feels well,    ENT Normal appearing ears and nares bilaterally Skin/Breast  No rash, sores, jaundice, itching, dryness Cardiovascular  No chest pain, shortness of breath, or edema  Pulmonary  No cough or wheeze.  Gastro Intestinal  No nausea, vomitting, or diarrhoea. No bright red blood per rectum, no abdominal pain, change in bowel movement, or constipation.  Genito Urinary  No frequency, urgency, dysuria, no bleeding Musculo Skeletal  + LLE edema 3+ (stable, preceeded surgery) No weakness, numbness, change in gait,  Psychology  No depression, anxiety, insomnia.   Vitals:  Blood pressure (!) 147/85, pulse 81, temperature 98.2 F (36.8 C), temperature source Oral, resp. rate 18, height 5\' 3"  (1.6 m), weight 181 lb 3.2 oz (82.2 kg), last menstrual period 02/06/2016, SpO2 100 %.  Physical Exam: WD in NAD Neck  Supple NROM, without any enlargements.  Lymph Node Survey No cervical supraclavicular or inguinal adenopathy however there is fullness in the left inguinal region. Cardiovascular  Pulse normal rate,  regularity and rhythm. S1 and S2 normal.  Lungs  Clear to auscultation bilateraly, without wheezes/crackles/rhonchi. Good air movement.  Skin  No rash/lesions/breakdown  Psychiatry  Alert and oriented to person, place, and time  Abdomen  Normoactive bowel sounds, abdomen soft, non-tender and nonobese without evidence of hernia. Soft abdominal incisions. Back No CVA tenderness Genito Urinary  Vulva/vagina: normal external genitalia. No lesions, blood, masses. Surgically absent cervix and uterus. Pap taken.  Rectal  Good tone, no masses no cul de sac nodularity.  Extremities  +1 left LE edema   Thereasa Solo, MD  11/16/2018, 1:03 PM

## 2018-11-19 LAB — CYTOLOGY - PAP
Diagnosis: NEGATIVE
HPV: NOT DETECTED

## 2018-11-20 ENCOUNTER — Telehealth: Payer: Self-pay

## 2018-11-20 NOTE — Telephone Encounter (Signed)
Told Ms Frakes  her PAP Smear results as noted below by [facility]

## 2018-11-20 NOTE — Telephone Encounter (Signed)
-----   Message from Dorothyann Gibbs, NP sent at 11/19/2018  3:14 PM EDT ----- Please let her know pap is negative for pre-cancer or cancer and negative for high risk HPV. ----- Message ----- From: Interface, Lab In Three Zero One Sent: 11/19/2018   2:55 PM EDT To: Dorothyann Gibbs, NP

## 2019-04-01 ENCOUNTER — Ambulatory Visit: Payer: Self-pay | Attending: Radiation Oncology | Admitting: Radiation Oncology
